# Patient Record
Sex: Female | Born: 1960
Health system: Midwestern US, Community
[De-identification: ages and names within clinical notes are randomized; demographics above are authoritative.]

## PROBLEM LIST (undated history)

## (undated) DIAGNOSIS — S8991XA Unspecified injury of right lower leg, initial encounter: Secondary | ICD-10-CM

## (undated) DIAGNOSIS — E785 Hyperlipidemia, unspecified: Secondary | ICD-10-CM

## (undated) DIAGNOSIS — Z0000 Encounter for general adult medical examination without abnormal findings: Secondary | ICD-10-CM

## (undated) DIAGNOSIS — M419 Scoliosis, unspecified: Secondary | ICD-10-CM

## (undated) DIAGNOSIS — Z780 Asymptomatic menopausal state: Secondary | ICD-10-CM

## (undated) DIAGNOSIS — M1712 Unilateral primary osteoarthritis, left knee: Secondary | ICD-10-CM

## (undated) DIAGNOSIS — B019 Varicella without complication: Secondary | ICD-10-CM

## (undated) DIAGNOSIS — N390 Urinary tract infection, site not specified: Principal | ICD-10-CM

## (undated) DIAGNOSIS — G43829 Menstrual migraine, not intractable, without status migrainosus: Secondary | ICD-10-CM

## (undated) HISTORY — DX: Asymptomatic menopausal state: Z78.0

## (undated) HISTORY — DX: Unilateral primary osteoarthritis, left knee: M17.12

## (undated) HISTORY — DX: Urinary tract infection, site not specified: N39.0

## (undated) HISTORY — DX: Varicella without complication: B01.9

## (undated) HISTORY — DX: Menstrual migraine, not intractable, without status migrainosus: G43.829

## (undated) HISTORY — DX: Scoliosis, unspecified: M41.9

## (undated) HISTORY — DX: Unspecified injury of right lower leg, initial encounter: S89.91XA

## (undated) HISTORY — DX: Hyperlipidemia, unspecified: E78.5

## (undated) HISTORY — DX: Encounter for general adult medical examination without abnormal findings: Z00.00

---

## 1996-11-05 LAB — CONVERTED CEMR LAB
Cholesterol: 217 mg/dL
LDL Cholesterol: 108 mg/dL

## 2005-02-09 ENCOUNTER — Other Ambulatory Visit: Admission: RE | Admit: 2005-02-09 | Discharge: 2005-02-09 | Payer: Self-pay | Admitting: Obstetrics and Gynecology

## 2005-05-19 ENCOUNTER — Ambulatory Visit (HOSPITAL_COMMUNITY): Admission: RE | Admit: 2005-05-19 | Discharge: 2005-05-19 | Payer: Self-pay | Admitting: *Deleted

## 2006-03-18 ENCOUNTER — Other Ambulatory Visit: Admission: RE | Admit: 2006-03-18 | Discharge: 2006-03-18 | Payer: Self-pay | Admitting: Obstetrics & Gynecology

## 2006-05-23 ENCOUNTER — Ambulatory Visit (HOSPITAL_COMMUNITY): Admission: RE | Admit: 2006-05-23 | Discharge: 2006-05-23 | Payer: Self-pay | Admitting: Obstetrics and Gynecology

## 2007-03-20 ENCOUNTER — Other Ambulatory Visit: Admission: RE | Admit: 2007-03-20 | Discharge: 2007-03-20 | Payer: Self-pay | Admitting: Obstetrics & Gynecology

## 2007-05-27 LAB — CONVERTED CEMR LAB: Pap Smear: NORMAL

## 2007-06-01 ENCOUNTER — Ambulatory Visit (HOSPITAL_COMMUNITY): Admission: RE | Admit: 2007-06-01 | Discharge: 2007-06-01 | Payer: Self-pay | Admitting: Obstetrics and Gynecology

## 2007-09-01 ENCOUNTER — Ambulatory Visit: Payer: Self-pay | Admitting: Internal Medicine

## 2007-09-01 LAB — CONVERTED CEMR LAB
Bilirubin Urine: NEGATIVE
Blood in Urine, dipstick: NEGATIVE
Glucose, Urine, Semiquant: NEGATIVE
Ketones, urine, test strip: NEGATIVE
Nitrite: NEGATIVE
Protein, U semiquant: NEGATIVE
Specific Gravity, Urine: 1.01
Urobilinogen, UA: 0.2
WBC Urine, dipstick: NEGATIVE
pH: 7

## 2007-09-04 LAB — CONVERTED CEMR LAB
ALT: 20 units/L (ref 0–35)
AST: 19 units/L (ref 0–37)
Albumin: 4.1 g/dL (ref 3.5–5.2)
Alkaline Phosphatase: 41 units/L (ref 39–117)
BUN: 9 mg/dL (ref 6–23)
Basophils Absolute: 0.1 10*3/uL (ref 0.0–0.1)
Basophils Relative: 0.8 % (ref 0.0–1.0)
Bilirubin, Direct: 0.2 mg/dL (ref 0.0–0.3)
CO2: 28 meq/L (ref 19–32)
Calcium: 9.4 mg/dL (ref 8.4–10.5)
Chloride: 101 meq/L (ref 96–112)
Cholesterol: 255 mg/dL (ref 0–200)
Creatinine, Ser: 0.8 mg/dL (ref 0.4–1.2)
Direct LDL: 127.4 mg/dL
Eosinophils Absolute: 0.1 10*3/uL (ref 0.0–0.6)
Eosinophils Relative: 1.9 % (ref 0.0–5.0)
GFR calc Af Amer: 99 mL/min
GFR calc non Af Amer: 82 mL/min
Glucose, Bld: 118 mg/dL — ABNORMAL HIGH (ref 70–99)
HCT: 39.6 % (ref 36.0–46.0)
HDL: 95.5 mg/dL (ref >39.0)
Hemoglobin: 13.4 g/dL (ref 12.0–15.0)
Lymphocytes Relative: 32.3 % (ref 12.0–46.0)
MCHC: 33.9 g/dL (ref 30.0–36.0)
MCV: 92.1 fL (ref 78.0–100.0)
Monocytes Absolute: 0.5 10*3/uL (ref 0.2–0.7)
Monocytes Relative: 7.4 % (ref 3.0–11.0)
Neutro Abs: 4 10*3/uL (ref 1.4–7.7)
Neutrophils Relative %: 57.6 % (ref 43.0–77.0)
Platelets: 227 10*3/uL (ref 150–400)
Potassium: 3.7 meq/L (ref 3.5–5.1)
RBC: 4.3 M/uL (ref 3.87–5.11)
RDW: 12.4 % (ref 11.5–14.6)
Sodium: 137 meq/L (ref 135–145)
TSH: 0.69 microintl units/mL (ref 0.35–5.50)
Total Bilirubin: 0.7 mg/dL (ref 0.3–1.2)
Total CHOL/HDL Ratio: 2.7
Total Protein: 6.4 g/dL (ref 6.0–8.3)
Triglycerides: 150 mg/dL — ABNORMAL HIGH (ref 0–149)
VLDL: 30 mg/dL (ref 0–40)
WBC: 7 10*3/uL (ref 4.5–10.5)

## 2007-09-29 ENCOUNTER — Ambulatory Visit: Payer: Self-pay | Admitting: Internal Medicine

## 2007-10-16 ENCOUNTER — Telehealth: Payer: Self-pay | Admitting: Internal Medicine

## 2008-04-30 ENCOUNTER — Other Ambulatory Visit: Admission: RE | Admit: 2008-04-30 | Discharge: 2008-04-30 | Payer: Self-pay | Admitting: Obstetrics and Gynecology

## 2008-08-29 ENCOUNTER — Ambulatory Visit (HOSPITAL_COMMUNITY): Admission: RE | Admit: 2008-08-29 | Discharge: 2008-08-29 | Payer: Self-pay | Admitting: Obstetrics and Gynecology

## 2009-05-20 ENCOUNTER — Telehealth: Payer: Self-pay | Admitting: Internal Medicine

## 2009-07-26 HISTORY — PX: ARTHROSCOPIC REPAIR ACL: SUR80

## 2010-09-07 ENCOUNTER — Other Ambulatory Visit: Payer: Self-pay | Admitting: Obstetrics and Gynecology

## 2010-09-07 DIAGNOSIS — Z1231 Encounter for screening mammogram for malignant neoplasm of breast: Secondary | ICD-10-CM

## 2010-09-14 ENCOUNTER — Ambulatory Visit (HOSPITAL_COMMUNITY)
Admission: RE | Admit: 2010-09-14 | Discharge: 2010-09-14 | Disposition: A | Discharge status: Home / Self Care | Payer: Managed Care, Other (non HMO) | Source: Ambulatory Visit | Attending: Obstetrics and Gynecology | Admitting: Obstetrics and Gynecology

## 2010-09-14 DIAGNOSIS — Z1231 Encounter for screening mammogram for malignant neoplasm of breast: Secondary | ICD-10-CM

## 2010-09-14 LAB — HM MAMMOGRAPHY

## 2011-07-22 ENCOUNTER — Ambulatory Visit: Payer: Managed Care, Other (non HMO) | Admitting: Family Medicine

## 2011-08-12 LAB — HM PAP SMEAR

## 2011-08-19 ENCOUNTER — Encounter: Payer: Self-pay | Admitting: Family Medicine

## 2011-08-19 ENCOUNTER — Ambulatory Visit (INDEPENDENT_AMBULATORY_CARE_PROVIDER_SITE_OTHER): Payer: Managed Care, Other (non HMO) | Admitting: Family Medicine

## 2011-08-19 DIAGNOSIS — M4120 Other idiopathic scoliosis, site unspecified: Secondary | ICD-10-CM

## 2011-08-19 DIAGNOSIS — E785 Hyperlipidemia, unspecified: Secondary | ICD-10-CM

## 2011-08-19 DIAGNOSIS — Z0000 Encounter for general adult medical examination without abnormal findings: Secondary | ICD-10-CM

## 2011-08-19 DIAGNOSIS — M419 Scoliosis, unspecified: Secondary | ICD-10-CM

## 2011-08-19 DIAGNOSIS — M25552 Pain in left hip: Secondary | ICD-10-CM

## 2011-08-19 DIAGNOSIS — M25559 Pain in unspecified hip: Secondary | ICD-10-CM

## 2011-08-19 DIAGNOSIS — L989 Disorder of the skin and subcutaneous tissue, unspecified: Secondary | ICD-10-CM

## 2011-08-19 DIAGNOSIS — R0789 Other chest pain: Secondary | ICD-10-CM

## 2011-08-19 DIAGNOSIS — G43829 Menstrual migraine, not intractable, without status migrainosus: Secondary | ICD-10-CM | POA: Insufficient documentation

## 2011-08-19 HISTORY — DX: Hyperlipidemia, unspecified: E78.5

## 2011-08-19 NOTE — Patient Instructions (Signed)

## 2011-08-24 ENCOUNTER — Other Ambulatory Visit (INDEPENDENT_AMBULATORY_CARE_PROVIDER_SITE_OTHER): Payer: Managed Care, Other (non HMO)

## 2011-08-24 DIAGNOSIS — Z0000 Encounter for general adult medical examination without abnormal findings: Secondary | ICD-10-CM

## 2011-08-24 DIAGNOSIS — E785 Hyperlipidemia, unspecified: Secondary | ICD-10-CM

## 2011-08-24 LAB — LIPID PANEL
Cholesterol: 243 mg/dL — ABNORMAL HIGH (ref 0–200)
HDL: 84.1 mg/dL (ref >39.00)
Total CHOL/HDL Ratio: 3
Triglycerides: 75 mg/dL (ref 0.0–149.0)
VLDL: 15 mg/dL (ref 0.0–40.0)

## 2011-08-24 LAB — RENAL FUNCTION PANEL
Albumin: 4.1 g/dL (ref 3.5–5.2)
BUN: 14 mg/dL (ref 6–23)
CO2: 26 mEq/L (ref 19–32)
Calcium: 8.8 mg/dL (ref 8.4–10.5)
Chloride: 107 mEq/L (ref 96–112)
Creatinine, Ser: 0.7 mg/dL (ref 0.4–1.2)
GFR: 89.36 mL/min (ref >60.00)
Glucose, Bld: 88 mg/dL (ref 70–99)
Phosphorus: 3.3 mg/dL (ref 2.3–4.6)
Potassium: 4.4 mEq/L (ref 3.5–5.1)
Sodium: 141 mEq/L (ref 135–145)

## 2011-08-24 LAB — CBC
HCT: 41.9 % (ref 36.0–46.0)
Hemoglobin: 14.2 g/dL (ref 12.0–15.0)
MCHC: 33.8 g/dL (ref 30.0–36.0)
MCV: 94.4 fl (ref 78.0–100.0)
Platelets: 195 10*3/uL (ref 150.0–400.0)
RBC: 4.44 Mil/uL (ref 3.87–5.11)
RDW: 14.1 % (ref 11.5–14.6)
WBC: 5.3 10*3/uL (ref 4.5–10.5)

## 2011-08-24 LAB — TSH: TSH: 0.96 u[IU]/mL (ref 0.35–5.50)

## 2011-08-24 LAB — HEPATIC FUNCTION PANEL
ALT: 16 U/L (ref 0–35)
AST: 21 U/L (ref 0–37)
Albumin: 4.1 g/dL (ref 3.5–5.2)
Alkaline Phosphatase: 41 U/L (ref 39–117)
Bilirubin, Direct: 0 mg/dL (ref 0.0–0.3)
Total Bilirubin: 0.5 mg/dL (ref 0.3–1.2)
Total Protein: 6.5 g/dL (ref 6.0–8.3)

## 2011-08-25 LAB — LDL CHOLESTEROL, DIRECT: Direct LDL: 141.6 mg/dL

## 2011-08-26 ENCOUNTER — Encounter: Payer: Self-pay | Admitting: Family Medicine

## 2011-08-26 DIAGNOSIS — Z0000 Encounter for general adult medical examination without abnormal findings: Secondary | ICD-10-CM

## 2011-08-26 DIAGNOSIS — M25552 Pain in left hip: Secondary | ICD-10-CM | POA: Insufficient documentation

## 2011-08-26 DIAGNOSIS — R0789 Other chest pain: Secondary | ICD-10-CM | POA: Insufficient documentation

## 2011-08-26 DIAGNOSIS — L989 Disorder of the skin and subcutaneous tissue, unspecified: Secondary | ICD-10-CM | POA: Insufficient documentation

## 2011-08-26 DIAGNOSIS — M419 Scoliosis, unspecified: Secondary | ICD-10-CM

## 2011-08-26 HISTORY — DX: Encounter for general adult medical examination without abnormal findings: Z00.00

## 2011-08-26 HISTORY — DX: Scoliosis, unspecified: M41.9

## 2011-08-26 NOTE — Progress Notes (Signed)
Patient ID: Veronica Aguilar, female   DOB: Aug 26, 1960, 51 y.o.   MRN: [id number] Yamileth Hayse [id number] Jan 07, 1961 08/26/2011      Progress Note New Patient  Subjective  Chief Complaint  Chief Complaint  Patient presents with  . Establish Care    transfer from [facility]    HPI   This 51 year old Caucasian female who is in today for new patient appointment. She has a few concerns today one is about skin lesion on her left anterior thigh which has been present for about 2 years. Is not painful does not bleed but has been slowly growing and is slightly raised. She generally reports good health. She's had no recent illness, fevers, chills, chest pain, palpitations, shortness of breath, GI or GU complaints. She does stop with migraines intermittently but is doing better on Micronor. Unfortunately Micronor has not the side effect of tender breasts but otherwise no complaints. She has a mammogram scheduled for next month and follows with OB/GYN for her ongoing gynecologic care.  PMH: see EPIC   Past Surgical History  Procedure Date  . Arthroscopic repair acl 07-26-2008    left    Family History  Problem Relation Age of Onset  . Cancer Mother 22    ovarian- remission  . Hyperlipidemia Mother   . Heart disease Father     valve replaced  . Obesity Brother   . Heart disease Brother     stent  . Macular degeneration Maternal Grandmother   . Appendicitis Maternal Grandfather   . Cancer Paternal Grandfather     lung  . Obesity Brother   . Obesity Brother   . Depression Daughter     History   Social History  . Marital Status: Married    Spouse Name: N/A    Number of Children: N/A  . Years of Education: N/A   Occupational History  . Not on file.   Social History Main Topics  . Smoking status: Never Smoker   . Smokeless tobacco: Never Used  . Alcohol Use: Yes     6 glasses of wine weekly  . Drug Use: No  . Sexually Active: Yes   Other Topics Concern  . Not on file    Social History Narrative  . No narrative on file    No current outpatient prescriptions on file prior to visit.    No Known Allergies  Review of Systems  Review of Systems  Constitutional: Negative for fever and malaise/fatigue.  HENT: Negative for congestion.   Eyes: Negative for discharge.  Respiratory: Negative for shortness of breath.   Cardiovascular: Negative for chest pain, palpitations and leg swelling.  Gastrointestinal: Negative for nausea, abdominal pain and diarrhea.  Genitourinary: Negative for dysuria, urgency, frequency, hematuria and flank pain.  Musculoskeletal: Negative for back pain, joint pain and falls.  Skin: Negative for rash.  Neurological: Negative for loss of consciousness and headaches.  Endo/Heme/Allergies: Negative for polydipsia.  Psychiatric/Behavioral: Negative for depression and suicidal ideas. The patient is not nervous/anxious and does not have insomnia.     Objective  BP 110/71  Pulse 72  Temp(Src) 98.4 F (36.9 C) (Temporal)  Ht 5\' 2"  (1.575 m)  Wt 115 lb 6.4 oz (52.345 kg)  BMI 21.11 kg/m2  SpO2 99%  LMP 07/30/2011  Physical Exam  Physical Exam  Constitutional: She is oriented to person, place, and time and well-developed, well-nourished, and in no distress. No distress.  HENT:  Head: Normocephalic and atraumatic.  Right Ear: External ear normal.  Left Ear: External ear normal.  Nose: Nose normal.  Mouth/Throat: Oropharynx is clear and moist. No oropharyngeal exudate.  Eyes: Conjunctivae are normal. Pupils are equal, round, and reactive to light. Right eye exhibits no discharge. Left eye exhibits no discharge. No scleral icterus.  Neck: Normal range of motion. Neck supple. No thyromegaly present.  Cardiovascular: Normal rate, regular rhythm, normal heart sounds and intact distal pulses.   No murmur heard. Pulmonary/Chest: Effort normal and breath sounds normal. No respiratory distress. She has no wheezes. She has no rales.   Abdominal: Soft. Bowel sounds are normal. She exhibits no distension and no mass. There is no tenderness.  Musculoskeletal: Normal range of motion. She exhibits no edema and no tenderness.  Lymphadenopathy:    She has no cervical adenopathy.  Neurological: She is alert and oriented to person, place, and time. She has normal reflexes. No cranial nerve deficit. Coordination normal.  Skin: Skin is warm and dry. No rash noted. She is not diaphoretic.       Small, raised, white, scaly lesion anterior thigh left  Psychiatric: Mood, memory and affect normal.       Assessment & Plan  Menstrual Migraine:  Doing better on Micronor.  Hyperlipidemia: avoid trans fats, increase exercise, start MegaRed caps daily  Preventative health care: encouraged heart healthy diet, increase exercise  Skin lesion: left anterior thigh, is white and scaly patient is referred to dermatology for further evaluation.

## 2011-08-26 NOTE — Assessment & Plan Note (Signed)
No pain at present encouraged to stay active and start a fatty acid supplement

## 2011-08-26 NOTE — Assessment & Plan Note (Signed)
Improved since swithcing to Micronor.

## 2011-08-26 NOTE — Assessment & Plan Note (Signed)
Encouraged heart healthy diet, regular exercise and will check fasting labs

## 2011-08-26 NOTE — Assessment & Plan Note (Addendum)
Patient referred to dermatology for evaluation and surveillance. Lesion in on left anterior thigh

## 2011-08-26 NOTE — Assessment & Plan Note (Signed)
Avoid trans fats, start MegaRed daily and will check lipid panel

## 2011-08-26 NOTE — Assessment & Plan Note (Deleted)
No pain at this time, has used Flexeril in the past with good effect.  

## 2011-09-06 ENCOUNTER — Other Ambulatory Visit: Payer: Self-pay | Admitting: Obstetrics and Gynecology

## 2011-09-06 DIAGNOSIS — Z1231 Encounter for screening mammogram for malignant neoplasm of breast: Secondary | ICD-10-CM

## 2011-09-30 ENCOUNTER — Ambulatory Visit (HOSPITAL_COMMUNITY)
Admission: RE | Admit: 2011-09-30 | Discharge: 2011-09-30 | Disposition: A | Discharge status: Home / Self Care | Payer: Managed Care, Other (non HMO) | Source: Ambulatory Visit | Attending: Obstetrics and Gynecology | Admitting: Obstetrics and Gynecology

## 2011-09-30 DIAGNOSIS — Z1231 Encounter for screening mammogram for malignant neoplasm of breast: Secondary | ICD-10-CM

## 2012-04-18 ENCOUNTER — Ambulatory Visit (INDEPENDENT_AMBULATORY_CARE_PROVIDER_SITE_OTHER): Payer: Managed Care, Other (non HMO) | Admitting: Family Medicine

## 2012-04-18 ENCOUNTER — Encounter: Payer: Self-pay | Admitting: Family Medicine

## 2012-04-18 VITALS — BP 106/72 | HR 67 | Temp 98.0°F | Ht 62.0 in | Wt 112.1 lb

## 2012-04-18 DIAGNOSIS — N390 Urinary tract infection, site not specified: Secondary | ICD-10-CM | POA: Insufficient documentation

## 2012-04-18 DIAGNOSIS — R319 Hematuria, unspecified: Secondary | ICD-10-CM

## 2012-04-18 HISTORY — DX: Urinary tract infection, site not specified: N39.0

## 2012-04-18 LAB — POCT URINALYSIS DIPSTICK
Bilirubin, UA: NEGATIVE
Glucose, UA: NEGATIVE
Ketones, UA: NEGATIVE
Nitrite, UA: NEGATIVE
Protein, UA: NEGATIVE
Spec Grav, UA: 1.01
Urobilinogen, UA: 0.2
pH, UA: 7

## 2012-04-18 MED ORDER — CIPROFLOXACIN HCL 500 MG PO TABS: 500.0000 mg | ORAL_TABLET | Freq: Two times a day (BID) | ORAL | Status: DC

## 2012-04-18 MED ORDER — ALIGN PO CAPS: 1.0000 | ORAL_CAPSULE | Freq: Every day | ORAL | Status: AC

## 2012-04-18 MED ORDER — PHENAZOPYRIDINE HCL 200 MG PO TABS: 200.0000 mg | ORAL_TABLET | Freq: Three times a day (TID) | ORAL | Status: DC | PRN

## 2012-04-18 NOTE — Assessment & Plan Note (Signed)
Urine sent for culture, started on Ciprofloxacin and Pyridium, asked to increase fluids, start probiotic and cranberry

## 2012-04-18 NOTE — Progress Notes (Signed)
Patient ID: Veronica Aguilar, female   DOB: Dec 31, 1960, 51 y.o.   MRN: [id number] Veronica Aguilar [id number] 1961/04/21 04/18/2012      Progress Note-Follow Up  Subjective  Chief Complaint  Chief Complaint  Patient presents with  . Urinary Tract Infection    painful urination, blood in urine X this morning    HPI  Patient is a 51 year old Caucasian female who is in today with less than 24 hours worth of symptoms. This morning abruptly she started having dysuria and hematuria. She says the hematuria is resolved but she is having urinary urgency and frequency as well. She denies any fevers, chills, back pain, abdominal pain, GI complaints. No chest pain or palpitations other recent illness or shortness of breath or noted. She has increase her fluid consumption and start some cranberry but her symptoms persist   Past Medical History  Diagnosis Date  . Chicken pox as a child  . Measles as a child  . Menstrual migraine   . Hyperlipidemia 08/19/2011  . Scoliosis 08/26/2011  . Preventative health care 08/26/2011  . UTI (lower urinary tract infection) 04/18/2012    Past Surgical History  Procedure Date  . Arthroscopic repair acl 07-26-2008    left    Family History  Problem Relation Age of Onset  . Cancer Mother 37    ovarian- remission  . Hyperlipidemia Mother   . Heart disease Father     valve replaced  . Obesity Brother   . Heart disease Brother     stent  . Macular degeneration Maternal Grandmother   . Appendicitis Maternal Grandfather   . Cancer Paternal Grandfather     lung  . Obesity Brother   . Obesity Brother   . Depression Daughter     History   Social History  . Marital Status: Married    Spouse Name: N/A    Number of Children: N/A  . Years of Education: N/A   Occupational History  . Not on file.   Social History Main Topics  . Smoking status: Never Smoker   . Smokeless tobacco: Never Used  . Alcohol Use: Yes     6 glasses of wine weekly  . Drug Use: No   . Sexually Active: Yes   Other Topics Concern  . Not on file   Social History Narrative  . No narrative on file    Current Outpatient Prescriptions on File Prior to Visit  Medication Sig Dispense Refill  . norethindrone (MICRONOR,CAMILA,ERRIN) 0.35 MG tablet Take 1 tablet by mouth daily.        No Known Allergies  Review of Systems  Review of Systems  Constitutional: Negative for fever, chills and malaise/fatigue.  HENT: Negative for congestion.   Eyes: Negative for discharge.  Respiratory: Negative for shortness of breath.   Cardiovascular: Negative for chest pain, palpitations and leg swelling.  Gastrointestinal: Negative for nausea, abdominal pain, diarrhea, constipation and blood in stool.  Genitourinary: Positive for dysuria, urgency, frequency and hematuria. Negative for flank pain.  Musculoskeletal: Negative for myalgias, back pain and falls.  Skin: Negative for rash.  Neurological: Negative for loss of consciousness and headaches.  Endo/Heme/Allergies: Negative for polydipsia.  Psychiatric/Behavioral: Negative for depression and suicidal ideas. The patient is not nervous/anxious and does not have insomnia.     Objective  BP 106/72  Pulse 67  Temp 98 F (36.7 C) (Temporal)  Ht 5\' 2"  (1.575 m)  Wt 112 lb 1.9 oz (50.857 kg)  BMI 20.51 kg/m2  SpO2  100%  LMP 03/11/2012  Physical Exam  Physical Exam  Constitutional: She is oriented to person, place, and time and well-developed, well-nourished, and in no distress. No distress.  HENT:  Head: Normocephalic and atraumatic.  Eyes: Conjunctivae normal are normal.  Neck: Neck supple. No thyromegaly present.  Cardiovascular: Normal rate, regular rhythm and normal heart sounds.   No murmur heard. Pulmonary/Chest: Effort normal and breath sounds normal. She has no wheezes.  Abdominal: She exhibits no distension and no mass.  Musculoskeletal: She exhibits no edema.  Lymphadenopathy:    She has no cervical  adenopathy.  Neurological: She is alert and oriented to person, place, and time.  Skin: Skin is warm and dry. No rash noted. She is not diaphoretic.  Psychiatric: Memory, affect and judgment normal.    Lab Results  Component Value Date   TSH 0.96 08/24/2011   Lab Results  Component Value Date   WBC 5.3 08/24/2011   HGB 14.2 08/24/2011   HCT 41.9 08/24/2011   MCV 94.4 08/24/2011   PLT 195.0 08/24/2011   Lab Results  Component Value Date   CREATININE 0.7 08/24/2011   BUN 14 08/24/2011   NA 141 08/24/2011   K 4.4 08/24/2011   CL 107 08/24/2011   CO2 26 08/24/2011   Lab Results  Component Value Date   ALT 16 08/24/2011   AST 21 08/24/2011   ALKPHOS 41 08/24/2011   BILITOT 0.5 08/24/2011   Lab Results  Component Value Date   CHOL 243* 08/24/2011   Lab Results  Component Value Date   HDL 84.10 08/24/2011   Lab Results  Component Value Date   LDLCALC 108 11/05/1996   Lab Results  Component Value Date   TRIG 75.0 08/24/2011   Lab Results  Component Value Date   CHOLHDL 3 08/24/2011     Assessment & Plan  UTI (lower urinary tract infection) Urine sent for culture, started on Ciprofloxacin and Pyridium, asked to increase fluids, start probiotic and cranberry

## 2012-04-18 NOTE — Patient Instructions (Addendum)

## 2012-04-21 LAB — URINE CULTURE: Colony Count: >=100000

## 2012-09-09 ENCOUNTER — Other Ambulatory Visit: Payer: Self-pay

## 2013-05-31 ENCOUNTER — Other Ambulatory Visit: Payer: Self-pay

## 2013-06-13 ENCOUNTER — Other Ambulatory Visit: Payer: Self-pay | Admitting: Certified Nurse Midwife

## 2013-06-13 DIAGNOSIS — Z1231 Encounter for screening mammogram for malignant neoplasm of breast: Secondary | ICD-10-CM

## 2013-07-04 ENCOUNTER — Ambulatory Visit (HOSPITAL_COMMUNITY)
Admission: RE | Admit: 2013-07-04 | Discharge: 2013-07-04 | Disposition: A | Discharge status: Home / Self Care | Payer: Managed Care, Other (non HMO) | Source: Ambulatory Visit | Attending: Certified Nurse Midwife | Admitting: Certified Nurse Midwife

## 2013-07-04 DIAGNOSIS — Z1231 Encounter for screening mammogram for malignant neoplasm of breast: Secondary | ICD-10-CM | POA: Insufficient documentation

## 2013-08-09 ENCOUNTER — Encounter: Payer: Self-pay | Admitting: Certified Nurse Midwife

## 2013-08-13 ENCOUNTER — Encounter: Payer: Self-pay | Admitting: Certified Nurse Midwife

## 2013-08-13 ENCOUNTER — Ambulatory Visit (INDEPENDENT_AMBULATORY_CARE_PROVIDER_SITE_OTHER): Payer: Managed Care, Other (non HMO) | Admitting: Certified Nurse Midwife

## 2013-08-13 VITALS — BP 110/64 | HR 68 | Resp 16 | Ht 62.75 in | Wt 118.0 lb

## 2013-08-13 DIAGNOSIS — Z01419 Encounter for gynecological examination (general) (routine) without abnormal findings: Secondary | ICD-10-CM

## 2013-08-13 DIAGNOSIS — N951 Menopausal and female climacteric states: Secondary | ICD-10-CM

## 2013-08-13 DIAGNOSIS — Z0000 Encounter for general adult medical examination without abnormal findings: Secondary | ICD-10-CM

## 2013-08-13 LAB — POCT URINALYSIS DIPSTICK
Bilirubin, UA: NEGATIVE
Blood, UA: NEGATIVE
Glucose, UA: NEGATIVE
Ketones, UA: NEGATIVE
Leukocytes, UA: NEGATIVE
Nitrite, UA: NEGATIVE
Protein, UA: NEGATIVE
Urobilinogen, UA: NEGATIVE
pH, UA: 5

## 2013-08-13 LAB — HEMOGLOBIN, FINGERSTICK: Hemoglobin, fingerstick: 13.3 g/dL (ref 12.0–16.0)

## 2013-08-13 NOTE — Progress Notes (Signed)
Reviewed personally.  M. Suzanne Oliveah Zwack, MD.  

## 2013-08-13 NOTE — Patient Instructions (Signed)

## 2013-08-13 NOTE — Progress Notes (Signed)
53 y.o. [id number] Married Caucasian Fe here for annual exam. Last period 02/2013. Prior to August periods were every 2 months even with Micronor use. Last labs with PCP in 2013 all normal. No health issues today. Patient declines PUS and CA125 limited screening for ovarian cancer.  Patient's last menstrual period was 02/23/2013.          Sexually active: yes  The current method of family planning is oral progesterone-only contraceptive.    Exercising: yes  walking,tennis Smoker:  no  Health Maintenance: Pap:  08-10-11 neg HPV HR neg MMG:  07-04-13 normal category c density Colonoscopy:  none BMD:   none TDaP:  2011 Labs: Poct urine-neg, Hgb-13.3 Self breast exam: done monthly   reports that she has never smoked. She has never used smokeless tobacco. She reports that she drinks about 3.0 ounces of alcohol per week. She reports that she does not use illicit drugs.  Past Medical History  Diagnosis Date  . Chicken pox as a child  . Measles as a child  . Menstrual migraine     with aura  . Hyperlipidemia 08/19/2011  . Scoliosis 08/26/2011  . Preventative health care 08/26/2011  . UTI (lower urinary tract infection) 04/18/2012    Past Surgical History  Procedure Laterality Date  . Arthroscopic repair acl  2011    left    Current Outpatient Prescriptions  Medication Sig Dispense Refill  . CALCIUM PO Take by mouth daily.      . norethindrone (MICRONOR,CAMILA,ERRIN) 0.35 MG tablet Take 1 tablet by mouth daily.      . Omega-3 Fatty Acids (FISH OIL PO) Take by mouth daily.       No current facility-administered medications for this visit.    Family History  Problem Relation Age of Onset  . Cancer Mother 2    ovarian- remission  . Hyperlipidemia Mother   . Heart disease Father     valve replaced  . Glaucoma Father   . Heart disease Brother     stent  . Cancer Brother     melanoma  . Macular degeneration Maternal Grandmother   . Appendicitis Maternal Grandfather   . Cancer  Paternal Grandfather     bone  . Depression Daughter     ROS:  Pertinent items are noted in HPI.  Otherwise, a comprehensive ROS was negative.  Exam:   BP 110/64  Pulse 68  Resp 16  Ht 5' 2.75" (1.594 m)  Wt 118 lb (53.524 kg)  BMI 21.07 kg/m2  LMP 02/23/2013 Height: 5' 2.75" (159.4 cm)  Ht Readings from Last 3 Encounters:  08/13/13 5' 2.75" (1.594 m)  04/18/12 5\' 2"  (1.575 m)  08/19/11 5\' 2"  (1.575 m)    General appearance: alert, cooperative and appears stated age Head: Normocephalic, without obvious abnormality, atraumatic Neck: no adenopathy, supple, symmetrical, trachea midline and thyroid normal to inspection and palpation Lungs: clear to auscultation bilaterally Breasts: normal appearance, no masses or tenderness, No nipple retraction or dimpling, No nipple discharge or bleeding, No axillary or supraclavicular adenopathy Heart: regular rate and rhythm Abdomen: soft, non-tender; no masses,  no organomegaly Extremities: extremities normal, atraumatic, no cyanosis or edema Skin: Skin color, texture, turgor normal. No rashes or lesions Lymph nodes: Cervical, supraclavicular, and axillary nodes normal. No abnormal inguinal nodes palpated Neurologic: Grossly normal   Pelvic: External genitalia:  no lesions              Urethra:  normal appearing urethra with no masses, tenderness  or lesions              Bartholin's and Skene's: normal                 Vagina: normal appearing vagina with normal color and discharge, no lesions              Cervix: normal appearance, non tender              Pap taken: yes Bimanual Exam:  Uterus:  normal size, contour, position, consistency, mobility, non-tender and anteflexed              Adnexa: normal adnexa and no mass, fullness, tenderness               Rectovaginal: Confirms               Anus:  normal sphincter tone, no lesions  A:  Well Woman with normal exam  Contraception POP due to Migraine with aura  Family history of Ovarian  cancer (M 65)  Colonoscopy due  P:   Reviewed health and wellness pertinent to exam  Rx Micronor see order  Lab:FSH, consider AMH after [facility] results   Discussed limited screen for ovarian cancer with PUS and CA 125, and genetic screening option should be considered. Patient declines all. "Feel OK with decision  Discussed risks and benefits with colonoscopy, plans to schedule with PCP.  Pap smear as per guidelines   Mammogram yearly with 3 D consideration pap smear taken with reflex  counseled on breast self exam, mammography screening, menopause, adequate intake of calcium and vitamin D, diet and exercise  return annually or prn  An After Visit Summary was printed and given to the patient.

## 2013-08-14 LAB — IPS PAP TEST WITH REFLEX TO HPV

## 2013-08-14 LAB — FOLLICLE STIMULATING HORMONE: FSH: 58.6 m[IU]/mL

## 2013-08-14 NOTE — Addendum Note (Signed)
Addended by: Regina Eck on: 08/14/2013 07:53 AM   Modules accepted: Orders

## 2013-08-17 LAB — ANTI MULLERIAN HORMONE

## 2013-08-20 ENCOUNTER — Encounter: Payer: Self-pay | Admitting: Certified Nurse Midwife

## 2013-08-21 ENCOUNTER — Telehealth: Payer: Self-pay

## 2013-08-21 NOTE — Telephone Encounter (Signed)
Patient notified of results.

## 2013-08-21 NOTE — Telephone Encounter (Signed)
Message copied by Susy Manor on Tue Aug 21, 2013  1:23 PM ------      Message from: Regina Eck      Created: Tue Aug 21, 2013 12:49 PM       Notify patient that [facility] shows  menopausal range which means she may or may not have another period. This is a range regarding menopausal changes.      The [place] that we talked about result does not show fertility.      I would prefer you stay on the Micronor at least 3 more months to see if any bleeding occurs. Then we can decide together about stopping use. ------

## 2013-08-21 NOTE — Telephone Encounter (Signed)
lmtcb

## 2013-10-23 ENCOUNTER — Other Ambulatory Visit: Payer: Self-pay | Admitting: Certified Nurse Midwife

## 2013-10-24 NOTE — Telephone Encounter (Signed)
eScribe request from [facility] for refill on [place] filled - 2014 Last AEX - 08/13/13 RX not sent at AEX.  RX sent until next AEX.

## 2014-05-27 ENCOUNTER — Encounter: Payer: Self-pay | Admitting: Certified Nurse Midwife

## 2014-07-09 ENCOUNTER — Other Ambulatory Visit: Payer: Self-pay | Admitting: Certified Nurse Midwife

## 2014-07-09 MED ORDER — NORETHINDRONE 0.35 MG PO TABS: 1.0000 | ORAL_TABLET | Freq: Every day | ORAL | Status: DC

## 2014-07-09 NOTE — Telephone Encounter (Signed)
Medication refill request: Norethindrone 0.35 mg  Last AEX:  08/13/13 with Ms. Debbie Next AEX: 08/19/14 with Ms. Debbie Last MMG (if hormonal medication request): 07/05/13 Bi-rads 1: Negative  Refill authorized: #[id number], please advise.

## 2014-07-09 NOTE — Telephone Encounter (Signed)
Pt requests birth control refill - will run out before aex in January.   Asks to send to [facility] (mail order)  bf

## 2014-07-09 NOTE — Telephone Encounter (Signed)
Patient notified that rx has been sent. 

## 2014-08-15 ENCOUNTER — Ambulatory Visit: Payer: Managed Care, Other (non HMO) | Admitting: Certified Nurse Midwife

## 2014-08-19 ENCOUNTER — Ambulatory Visit: Payer: Managed Care, Other (non HMO) | Admitting: Certified Nurse Midwife

## 2014-09-17 ENCOUNTER — Ambulatory Visit: Payer: Managed Care, Other (non HMO) | Admitting: Certified Nurse Midwife

## 2014-09-27 ENCOUNTER — Telehealth: Payer: Self-pay | Admitting: Certified Nurse Midwife

## 2014-09-27 MED ORDER — NORETHINDRONE 0.35 MG PO TABS: 1.0000 | ORAL_TABLET | Freq: Every day | ORAL | Status: DC

## 2014-09-27 NOTE — Telephone Encounter (Signed)
She can stay on Micronor, it is not a problem with drawing [place], will no affect results. Agreeable to plan

## 2014-09-27 NOTE — Telephone Encounter (Signed)
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 Last seen 08/13/13.

## 2014-09-27 NOTE — Telephone Encounter (Signed)
Spoke with patient. Patient states "I recently turned 44 and have not had a period in a year. I am on birth control but I am not sure how long I should continue to be on them." Advised patient will need to stay on birth control until we are able to confirm it is no longer needed. Advised to stay on birth control until aex on 11/20/2014 and discuss with provider and further recommendations can be made from there. Patient is agreeable. Requesting refills for birth control be sent to mail order on file until annual. Micronor #2 0RF sent to mail order on file. Patient is agreeable.    Regina Eck CNM anything further for this patient? Need to be off Micronor for any period of time before appointment to have labs drawn?

## 2014-11-05 ENCOUNTER — Other Ambulatory Visit: Payer: Self-pay | Admitting: Certified Nurse Midwife

## 2014-11-05 DIAGNOSIS — Z1231 Encounter for screening mammogram for malignant neoplasm of breast: Secondary | ICD-10-CM

## 2014-11-07 ENCOUNTER — Ambulatory Visit (HOSPITAL_COMMUNITY)
Admission: RE | Admit: 2014-11-07 | Discharge: 2014-11-07 | Disposition: A | Discharge status: Home / Self Care | Payer: Managed Care, Other (non HMO) | Source: Ambulatory Visit | Attending: Certified Nurse Midwife | Admitting: Certified Nurse Midwife

## 2014-11-07 DIAGNOSIS — Z1231 Encounter for screening mammogram for malignant neoplasm of breast: Secondary | ICD-10-CM | POA: Insufficient documentation

## 2014-11-13 ENCOUNTER — Encounter: Payer: Self-pay | Admitting: Obstetrics and Gynecology

## 2014-11-13 ENCOUNTER — Ambulatory Visit (INDEPENDENT_AMBULATORY_CARE_PROVIDER_SITE_OTHER): Payer: Managed Care, Other (non HMO) | Admitting: Obstetrics and Gynecology

## 2014-11-13 VITALS — BP 100/62 | HR 64 | Resp 18 | Ht 62.5 in | Wt 111.4 lb

## 2014-11-13 DIAGNOSIS — Z01419 Encounter for gynecological examination (general) (routine) without abnormal findings: Secondary | ICD-10-CM

## 2014-11-13 DIAGNOSIS — Z0000 Encounter for general adult medical examination without abnormal findings: Secondary | ICD-10-CM | POA: Diagnosis not present

## 2014-11-13 DIAGNOSIS — N939 Abnormal uterine and vaginal bleeding, unspecified: Secondary | ICD-10-CM

## 2014-11-13 LAB — POCT URINALYSIS DIPSTICK
Bilirubin, UA: NEGATIVE
Blood, UA: NEGATIVE
GLUCOSE UA: NEGATIVE
KETONES UA: NEGATIVE
LEUKOCYTES UA: NEGATIVE
Nitrite, UA: NEGATIVE
PROTEIN UA: NEGATIVE
Urobilinogen, UA: NEGATIVE
pH, UA: 5

## 2014-11-13 NOTE — Progress Notes (Signed)
Patient ID: Veronica Aguilar, female   DOB: May 09, 1961, 54 y.o.   MRN: [id number] 54 y.o. [id number] MarriedCaucasianF here for annual exam.   Asking when to stop oral contraceptives.  Having night sweats.  Patient is on Micronor.  Forgets to take it.  FSH 58.6 and AMH < 0.03 on 08/13/13.   Had spotting for 2 - 3 days in early March this year.  This occurred just after mother passed. LMP was one full year prior.  Hot flashes are back.  Night sweats.   Mother had prior ovarian cancer.  Mother just deceased  - dementia?  Has 2 girls.   PCP:  Gwyneth Revels, MD  Patient's last menstrual period was 09/13/2014.          Sexually active: Yes.   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    walking and plays tennis twice weekly. Smoker:  no  Health Maintenance: Pap:  08-13-13 wnl:no HPV testing History of abnormal Pap:  no MMG:  11-07-14 dense/nl:[facility] Colonoscopy:  NEVER BMD:   N/A TDaP:  2011 Screening Labs:   Hb today: PCP, Urine today: Neg   reports that she has never smoked. She has never used smokeless tobacco. She reports that she drinks about 4.2 - 8.4 oz of alcohol per week. She reports that she does not use illicit drugs.  Past Medical History  Diagnosis Date  . Chicken pox as a child  . Measles as a child  . Menstrual migraine     with aura  . Hyperlipidemia 08/19/2011  . Scoliosis 08/26/2011  . Preventative health care 08/26/2011  . UTI (lower urinary tract infection) 04/18/2012    Past Surgical History  Procedure Laterality Date  . Arthroscopic repair acl  2011    left    Current Outpatient Prescriptions  Medication Sig Dispense Refill  . CALCIUM PO Take by mouth daily.    . Misc Natural Products (OSTEO BI-FLEX TRIPLE STRENGTH PO) Take 1 tablet by mouth 2 (two) times daily.    . norethindrone (MICRONOR,CAMILA,ERRIN) 0.35 MG tablet Take 1 tablet (0.35 mg total) by mouth  daily. 2 Package 0  . Omega-3 Fatty Acids (FISH OIL PO) Take by mouth daily.     No current facility-administered medications for this visit.    Family History  Problem Relation Age of Onset  . Cancer Mother 58    ovarian- remission  . Hyperlipidemia Mother   . Heart disease Father     valve replaced  . Glaucoma Father   . Heart disease Brother     stent  . Cancer Brother     melanoma  . Macular degeneration Maternal Grandmother   . Appendicitis Maternal Grandfather   . Cancer Paternal Grandfather     bone  . Depression Daughter     ROS:  Pertinent items are noted in HPI.  Otherwise, a comprehensive ROS was negative.  Exam:   BP 100/62 mmHg  Pulse 64  Resp 18  Ht 5' 2.5" (1.588 m)  Wt 111 lb 6.4 oz (50.531 kg)  BMI 20.04 kg/m2  LMP 09/13/2014     Height: 5' 2.5" (158.8 cm)  Ht Readings from Last 3 Encounters:  11/13/14 5' 2.5" (1.588 m)  08/13/13 5' 2.75" (1.594 m)  04/18/12 5\' 2"  (1.575 m)    General appearance: alert, cooperative and appears stated age Head: Normocephalic, without obvious abnormality, atraumatic Neck: no adenopathy, supple, symmetrical, trachea midline and  thyroid normal to inspection and palpation Lungs: clear to auscultation bilaterally Breasts: normal appearance, no masses or tenderness, Inspection negative, No nipple retraction or dimpling, No nipple discharge or bleeding, No axillary or supraclavicular adenopathy Heart: regular rate and rhythm Abdomen: soft, non-tender; bowel sounds normal; no masses,  no organomegaly Extremities: extremities normal, atraumatic, no cyanosis or edema Skin: Skin color, texture, turgor normal. No rashes or lesions Lymph nodes: Cervical, supraclavicular, and axillary nodes normal. No abnormal inguinal nodes palpated Neurologic: Grossly normal   Pelvic: External genitalia:  no lesions              Urethra:  normal appearing urethra with no masses, tenderness or lesions              Bartholins and Skenes:  normal                 Vagina: normal appearing vagina with normal color and discharge, no lesions              Cervix: no lesions              Pap taken: No. Bimanual Exam:  Uterus:  normal size, contour, position, consistency, mobility, non-tender              Adnexa: normal adnexa and no mass, fullness, tenderness               Rectovaginal: Confirms               Anus:  normal sphincter tone, no lesions  Chaperone was present for exam.  A:  Well Woman with normal exam Postmenopausal bleeding.  On Micronor.  Menopausal symptoms.  Family history of ovarian cancer.   P:   Mammogram yearly.  pap smear not indicated.  Will have patient stop Micronor.  Will test [facility] and estradiol off OCPs for 2 weeks.  Return for pelvic ultrasound and potential endometrial biopsy.  Procedures explained.  Colonoscopy recommended. return annually or prn

## 2014-11-13 NOTE — Patient Instructions (Signed)
EXERCISE AND DIET:  We recommended that you start or continue a regular exercise program for good health. Regular exercise means any activity that makes your heart beat faster and makes you sweat.  We recommend exercising at least 30 minutes per day at least 3 days a week, preferably 4 or 5.  We also recommend a diet low in fat and sugar.  Inactivity, poor dietary choices and obesity can cause diabetes, heart attack, stroke, and kidney damage, among others.    ALCOHOL AND SMOKING:  Women should limit their alcohol intake to no more than 7 drinks/beers/glasses of wine (combined, not each!) per week. Moderation of alcohol intake to this level decreases your risk of breast cancer and liver damage. And of course, no recreational drugs are part of a healthy lifestyle.  And absolutely no smoking or even second hand smoke. Most people know smoking can cause heart and lung diseases, but did you know it also contributes to weakening of your bones? Aging of your skin?  Yellowing of your teeth and nails?  CALCIUM AND VITAMIN D:  Adequate intake of calcium and Vitamin D are recommended.  The recommendations for exact amounts of these supplements seem to change often, but generally speaking 600 mg of calcium (either carbonate or citrate) and 800 units of Vitamin D per day seems prudent. Certain women may benefit from higher intake of Vitamin D.  If you are among these women, your doctor will have told you during your visit.    PAP SMEARS:  Pap smears, to check for cervical cancer or precancers,  have traditionally been done yearly, although recent scientific advances have shown that most women can have pap smears less often.  However, every woman still should have a physical exam from her gynecologist every year. It will include a breast check, inspection of the vulva and vagina to check for abnormal growths or skin changes, a visual exam of the cervix, and then an exam to evaluate the size and shape of the uterus and  ovaries.  And after 54 years of age, a rectal exam is indicated to check for rectal cancers. We will also provide age appropriate advice regarding health maintenance, like when you should have certain vaccines, screening for sexually transmitted diseases, bone density testing, colonoscopy, mammograms, etc.   MAMMOGRAMS:  All women over 40 years old should have a yearly mammogram. Many facilities now offer a "3D" mammogram, which may cost around $50 extra out of pocket. If possible,  we recommend you accept the option to have the 3D mammogram performed.  It both reduces the number of women who will be called back for extra views which then turn out to be normal, and it is better than the routine mammogram at detecting truly abnormal areas.    COLONOSCOPY:  Colonoscopy to screen for colon cancer is recommended for all women at age 50.  We know, you hate the idea of the prep.  We agree, BUT, having colon cancer and not knowing it is worse!!  Colon cancer so often starts as a polyp that can be seen and removed at colonscopy, which can quite literally save your life!  And if your first colonoscopy is normal and you have no family history of colon cancer, most women don't have to have it again for 10 years.  Once every ten years, you can do something that may end up saving your life, right?  We will be happy to help you get it scheduled when you are ready.    Be sure to check your insurance coverage so you understand how much it will cost.  It may be covered as a preventative service at no cost, but you should check your particular policy.     Abdominal or Pelvic Ultrasound Ultrasound uses harmless sound waves instead of X-rays to take pictures of the inside of your body. A probe or wand device (transducer) is held up against your body to capture these pictures. The continually changing images can be recorded on videotape or film. Diagnostic ultrasound imaging is commonly called sonography or ultrasonography. There  are different types of ultrasound exams. An ultrasound of the gallbladder, liver, and pancreas can show gallstones, masses, cysts, inflammation, infection, or enlarged organs. An ultrasound of the kidneys can show cysts, masses, kidney stones, and kidney size and shape. A pelvic ultrasound can show the uterus, ovaries, and cysts or masses. An obstetrical ultrasound shows the position of the fetus, measurements for maturity, fetal heartbeat, and fetal organs. A breast, thyroid, or testicular ultrasound can show if a nodule is solid or cystic. RISKS AND COMPLICATIONS Ultrasound has been used for many years and has never shown any harmful effects. Studies in humans have shown no direct link between the use of ultrasound and adverse outcomes. BEFORE THE PROCEDURE Other than drinking water, do not eat or drink for 8 to 12 hours before the test or as directed by your caregiver. Follow any other diet instructions from your caregiver. If you are having a pelvic ultrasound, you may need to drink a lot of liquid before the exam. A full bladder helps to see the organs behind the bladder better. PROCEDURE  There is no pain in an ultrasound exam. A gel is applied to your skin, and the transducer is then placed on the area to be examined. The gel may feel cool. The gel wipes off easily, but it is a good idea to wear clothing that is easily washable. The images from inside your body are displayed on one or more monitors that look like small television screens. The returning sound waves produce pictures of the organs that were in the path of the sound sent from the transducer. The room is usually darkened during the exam. This makes it easier to see the images on the monitor. The ultrasound exam should take less than 1 hour. AFTER THE PROCEDURE You can safely drive home and return to regular activities immediately after the exam. Ask when your test results will be ready. Make sure you get your test results. Document  Released: 07/09/2000 Document Revised: 10/04/2011 Document Reviewed: 12/31/2010 [facility] Patient Information 2015 [place], [place]. This information is not intended to replace advice given to you by your health care provider. Make sure you discuss any questions you have with your health care provider.  Endometrial Biopsy Endometrial biopsy is a procedure in which a tissue sample is taken from inside the uterus. The tissue sample is then looked at under a microscope to see if the tissue is normal or abnormal. The endometrium is the lining of the uterus. This procedure helps determine where you are in your menstrual cycle and how hormone levels are affecting the lining of the uterus. This procedure may also be used to evaluate uterine bleeding or to diagnose endometrial cancer, tuberculosis, polyps, or inflammatory conditions.  LET [facility] CARE PROVIDER KNOW ABOUT:  Any allergies you have.  All medicines you are taking, including vitamins, herbs, eye drops, creams, and over-the-counter medicines.  Previous problems you or members of your  family have had with the use of anesthetics.  Any blood disorders you have.  Previous surgeries you have had.  Medical conditions you have.  Possibility of pregnancy. RISKS AND COMPLICATIONS Generally, this is a safe procedure. However, as with any procedure, complications can occur. Possible complications include:  Bleeding.  Pelvic infection.  Puncture of the uterine wall with the biopsy device (rare). BEFORE THE PROCEDURE   Keep a record of your menstrual cycles as directed by your health care provider. You may need to schedule your procedure for a specific time in your cycle.  You may want to bring a sanitary pad to wear home after the procedure.  Arrange for someone to drive you home after the procedure if you will be given a medicine to help you relax (sedative). PROCEDURE   You may be given a sedative to relax you.  You will lie on an  exam table with your feet and legs supported as in a pelvic exam.  Your health care provider will insert an instrument (speculum) into your vagina to see your cervix.  Your cervix will be cleansed with an antiseptic solution. A medicine (local anesthetic) will be used to numb the cervix.  A forceps instrument (tenaculum) will be used to hold your cervix steady for the biopsy.  A thin, rodlike instrument (uterine sound) will be inserted through your cervix to determine the length of your uterus and the location where the biopsy sample will be removed.  A thin, flexible tube (catheter) will be inserted through your cervix and into the uterus. The catheter is used to collect the biopsy sample from your endometrial tissue.  The catheter and speculum will then be removed, and the tissue sample will be sent to a lab for examination. AFTER THE PROCEDURE  You will rest in a recovery area until you are ready to go home.  You may have mild cramping and a small amount of vaginal bleeding for a few days after the procedure. This is normal.  Make sure you find out how to get your test results. Document Released: 11/12/2004 Document Revised: 03/14/2013 Document Reviewed: 12/27/2012 [facility] Patient Information 2015 [place], [place]. This information is not intended to replace advice given to you by your health care provider. Make sure you discuss any questions you have with your health care provider.

## 2014-11-19 ENCOUNTER — Telehealth: Payer: Self-pay | Admitting: Obstetrics and Gynecology

## 2014-11-19 NOTE — Telephone Encounter (Signed)
Left message for patient to call back. Need to go over benefits for PUS/EMB

## 2014-11-20 ENCOUNTER — Ambulatory Visit: Payer: Managed Care, Other (non HMO) | Admitting: Certified Nurse Midwife

## 2014-11-27 NOTE — Telephone Encounter (Signed)
Left message for patient to call back  

## 2015-01-01 ENCOUNTER — Telehealth: Payer: Self-pay | Admitting: Obstetrics and Gynecology

## 2015-01-01 NOTE — Telephone Encounter (Signed)
Left message for patient to call back. Following up to see if patient is ready to schedule PUS/EMB

## 2015-01-02 ENCOUNTER — Encounter: Payer: Managed Care, Other (non HMO) | Admitting: Family Medicine

## 2015-01-30 ENCOUNTER — Telehealth: Payer: Self-pay | Admitting: *Deleted

## 2015-01-30 NOTE — Telephone Encounter (Signed)
See next phone note for attempt to contact patient.  Routing to provider for final review. Encounter closed.

## 2015-01-30 NOTE — Telephone Encounter (Signed)
See previous phone notes with attempts to contact patient regarding scheduling. Follow-up call to patient regarding lab tests and pelvic ultrasound with endometrial biopsy.  Left message on cell (VM confirms Veronica Aguilar) and home number (number confirmation.)

## 2015-02-19 ENCOUNTER — Telehealth: Payer: Self-pay | Admitting: Family Medicine

## 2015-02-19 NOTE — Telephone Encounter (Signed)
Caller name: Stefannie Defeo Relationship to patient: self Can be reached: [phone]   Reason for call: GYN office wanted her to get some additional testing and ultrasound. She wants some advice from Dr. Charlett Blake.

## 2015-02-19 NOTE — Telephone Encounter (Signed)
Follow-up call to patient regarding ordered pelvic ultrasound. Patient states she spoke to someone (was actually billing coordinator) that she wants to speak to PCP before scheduling. She reports she feels fine and is not having any further bleeding. Scheduled for physical in September and she wants to discuss mothers ovarian cancer and need for additional testing with PCP.  Patient also states this testing is not covered by insurance.  Provided clarification to patient concerns: testing is covered by insurance; however, is being applied to deductible. Testing is to evaluate uterus, endometrial lining which is different than evaluating ovarian cancer. Although glad she is feeling well, PMB should always be evaluated. Amount of bleeding is not reflective of the degree of potential abnormality and that this is being ordered to rule out abnormal cells up to and including cancerous cells in the uterus. Offered to at least start with ultrasound and determine next step but advised that MD would review call. Also advised that we respect her right to seek additional opinion but since appointment is in September, would encourage her to call and ask over the phone rather than wait two more months. Patient states she will call Dr Azalee Course office tomorrow and let me know if decides to schedule.   Again, advised MD would review call. Any further follow-up?

## 2015-02-19 NOTE — Telephone Encounter (Signed)
I think that a reasonable place to start with the patient is blood work to see her hormonal status off of the [place]. Her ultrasound evaluation is a good way to look for abnormalities of the ovaries and the uterus. The need for biopsy can be determined further at that time.  Genetic counseling to determine risk of future ovarian cancer may be helpful for the patient in order to stratify her risk. Without more information, difficult to counsel patient about the meaning of the bleeding she had earlier this year.   I am happy to help, and I agree that I would not delay in completing her evaluation.

## 2015-02-20 NOTE — Telephone Encounter (Signed)
Patient has been called and informed of PCP response to question.  The patient did verbalize understanding and agreed to.

## 2015-02-20 NOTE — Telephone Encounter (Signed)
So I need to know what they are recommending and for what symptoms and then I can offer my thoughts. Please check with her.

## 2015-02-20 NOTE — Telephone Encounter (Signed)
Tough call. It could be the birth control but what it comes down to is we assume that post menopausal bleeding is cancer until we prove otherwise. This is a precaution so we do not miss anything, I am a worrier so I would encourage her to get the work up done as a precaution if they do the work up and it is negative it is done and we do not do anything else unless new bleeding occurs

## 2015-02-20 NOTE — Telephone Encounter (Signed)
Patient was seen in March by a new GYN.  At that time she spotted once in February but was on [facility] and missed taking a coouple of days.  The gyn was concerned and stated she is in menopause and should not spot, stated was troublesome and wanted her to have an Ultrasound and potentially a biopsy.  She wants to know if necessary and is having no symptoms and no bleeding, not since February.  She is just curious what her PCP states, she can wait until September to discuss if necessary as she is having no issues at this time, but GYN is being persistent to have these test done.

## 2015-02-20 NOTE — Telephone Encounter (Signed)
Called the patient left message to call back 

## 2015-02-25 NOTE — Telephone Encounter (Signed)
Call to patient, left message to call back. Follow- up to call from last week. "Reviewed with doctor and have new update."

## 2015-03-13 NOTE — Telephone Encounter (Signed)
Call to patient. Left message, calling to follow-up on call from couple of week ago. Left message to call back.

## 2015-03-18 ENCOUNTER — Telehealth: Payer: Self-pay | Admitting: Family Medicine

## 2015-03-18 ENCOUNTER — Other Ambulatory Visit: Payer: Self-pay | Admitting: Family Medicine

## 2015-03-18 DIAGNOSIS — Z1211 Encounter for screening for malignant neoplasm of colon: Secondary | ICD-10-CM

## 2015-03-18 NOTE — Telephone Encounter (Signed)
Called the patient informed requested referral done.

## 2015-03-18 NOTE — Telephone Encounter (Signed)
Ordered with [place]

## 2015-03-18 NOTE — Telephone Encounter (Signed)
Caller name: Arionna Relationship to patient: self Can be reached: [phone]  Reason for call: Pt is saying she is due for a colonoscopy. She has not had one before. She is asking for recommendation/referral to have one done. Please advise pt.

## 2015-03-18 NOTE — Telephone Encounter (Signed)
Advise on request for colonoscopy referral.  Patient has upcoming appointment with PCP on 04/11/15, but last appointment was on 04/18/12.Marland KitchenMarland Kitchen

## 2015-03-21 NOTE — Telephone Encounter (Signed)
Call to patient. Voice mail confirms "Veronica Aguilar." Left message this is follow-up to previous call, reviewed with dr Quincy Simmonds and have additional information for her. Left message to call back and can speak to any of the triage nurses.

## 2015-03-24 NOTE — Telephone Encounter (Signed)
No response from patient. Any additional follow-up? Please advise.

## 2015-03-24 NOTE — Telephone Encounter (Signed)
Please send letter to patient asking her to respond to [place] in 30 days.  We want to partner with her in her care and provide good recommendations regarding the bleeding episode she had.  Without further evaluation, it is difficult to render an opinion regarding the etiology of her bleeding.

## 2015-03-27 ENCOUNTER — Encounter: Payer: Self-pay | Admitting: *Deleted

## 2015-03-27 NOTE — Telephone Encounter (Signed)
Letter to your office for review. 

## 2015-04-01 NOTE — Telephone Encounter (Signed)
Veronica Aguilar,   After reviewing with Dr Quincy Simmonds, would you like to try to contact this patient again before we send a thirty day letter?  Letter is ready to be sent if patient does not respond. She has not returned my calls despite messages stating that I have "updated" information. Please advise.  CC: Dr Quincy Simmonds

## 2015-04-01 NOTE — Telephone Encounter (Signed)
I will try her when in office in am.

## 2015-04-03 ENCOUNTER — Telehealth: Payer: Self-pay | Admitting: Certified Nurse Midwife

## 2015-04-03 NOTE — Telephone Encounter (Signed)
Unable to leave message, will try again at end of day

## 2015-04-03 NOTE — Telephone Encounter (Signed)
Unable to leave message to call back, no answer

## 2015-04-04 ENCOUNTER — Telehealth: Payer: Self-pay | Admitting: Certified Nurse Midwife

## 2015-04-04 NOTE — Telephone Encounter (Signed)
LMTCB regarding evaluation.

## 2015-04-11 ENCOUNTER — Encounter: Payer: Managed Care, Other (non HMO) | Admitting: Family Medicine

## 2015-04-17 ENCOUNTER — Telehealth: Payer: Self-pay | Admitting: Obstetrics and Gynecology

## 2015-04-17 NOTE — Telephone Encounter (Signed)
Thirty day letter signed by me.  No response from patient for multiple attempts to contact.  Need to be sent registered.

## 2015-04-17 NOTE — Telephone Encounter (Signed)
Remove from referral que.  Will receive a 30 day letter as she is not responding to our recommendations.

## 2015-04-17 NOTE — Telephone Encounter (Signed)
Patient has an ultrasound order in referrals workque that was originally entered in April 2016. Due to multiple contact and scheduling issues, she is still not scheduled. Last communications were regarding sending patient 30 day letter. Please advise status of this so referrals may update status of order.

## 2015-04-23 ENCOUNTER — Telehealth: Payer: Self-pay | Admitting: Certified Nurse Midwife

## 2015-04-23 NOTE — Telephone Encounter (Signed)
Debbi,  Can you contact patient? She has been sent a 30 day letter for not returning to evaluate for post menopausal bleeding.

## 2015-04-23 NOTE — Telephone Encounter (Signed)
Patient calling requesting to speak with Ms. Debbie she has a question for her. Best callback #: [phone]

## 2015-04-24 ENCOUNTER — Encounter: Payer: Self-pay | Admitting: Behavioral Health

## 2015-04-24 ENCOUNTER — Telehealth: Payer: Self-pay | Admitting: Behavioral Health

## 2015-04-24 NOTE — Addendum Note (Signed)
Addended by: Eduard Roux E on: 04/24/2015 12:04 PM   Modules accepted: Orders, Medications

## 2015-04-24 NOTE — Telephone Encounter (Signed)
Pre-Visit Call completed with patient and chart updated.   Pre-Visit Info documented in Specialty Comments under [employer].    

## 2015-04-24 NOTE — Telephone Encounter (Signed)
Unable to reach patient at time of Pre-Visit Call.  Left message for patient to return call when available.    

## 2015-04-25 ENCOUNTER — Ambulatory Visit (INDEPENDENT_AMBULATORY_CARE_PROVIDER_SITE_OTHER): Payer: Managed Care, Other (non HMO) | Admitting: Family Medicine

## 2015-04-25 ENCOUNTER — Encounter: Payer: Self-pay | Admitting: Family Medicine

## 2015-04-25 VITALS — BP 102/62 | HR 81 | Temp 97.6°F | Ht 63.0 in | Wt 100.1 lb

## 2015-04-25 DIAGNOSIS — E785 Hyperlipidemia, unspecified: Secondary | ICD-10-CM | POA: Diagnosis not present

## 2015-04-25 DIAGNOSIS — N943 Premenstrual tension syndrome: Secondary | ICD-10-CM

## 2015-04-25 DIAGNOSIS — G43829 Menstrual migraine, not intractable, without status migrainosus: Secondary | ICD-10-CM | POA: Diagnosis not present

## 2015-04-25 DIAGNOSIS — Z0000 Encounter for general adult medical examination without abnormal findings: Secondary | ICD-10-CM | POA: Diagnosis not present

## 2015-04-25 LAB — CBC
HEMATOCRIT: 43.5 % (ref 36.0–46.0)
HEMOGLOBIN: 14.2 g/dL (ref 12.0–15.0)
MCHC: 32.6 g/dL (ref 30.0–36.0)
MCV: 91.4 fl (ref 78.0–100.0)
Platelets: 229 10*3/uL (ref 150.0–400.0)
RBC: 4.76 Mil/uL (ref 3.87–5.11)
RDW: 14.6 % (ref 11.5–15.5)
WBC: 4.8 10*3/uL (ref 4.0–10.5)

## 2015-04-25 LAB — COMPREHENSIVE METABOLIC PANEL
ALK PHOS: 70 U/L (ref 39–117)
ALT: 22 U/L (ref 0–35)
AST: 21 U/L (ref 0–37)
Albumin: 4.5 g/dL (ref 3.5–5.2)
BUN: 11 mg/dL (ref 6–23)
CO2: 31 mEq/L (ref 19–32)
Calcium: 9.9 mg/dL (ref 8.4–10.5)
Chloride: 107 mEq/L (ref 96–112)
Creatinine, Ser: 0.79 mg/dL (ref 0.40–1.20)
GFR: 80.43 mL/min (ref >60.00)
GLUCOSE: 101 mg/dL — AB (ref 70–99)
POTASSIUM: 5.1 meq/L (ref 3.5–5.1)
Sodium: 148 mEq/L — ABNORMAL HIGH (ref 135–145)
TOTAL PROTEIN: 7 g/dL (ref 6.0–8.3)
Total Bilirubin: 0.5 mg/dL (ref 0.2–1.2)

## 2015-04-25 LAB — LIPID PANEL
Cholesterol: 264 mg/dL — ABNORMAL HIGH (ref 0–200)
HDL: 104.6 mg/dL (ref >39.00)
LDL Cholesterol: 139 mg/dL — ABNORMAL HIGH (ref 0–99)
NonHDL: 159.02
Total CHOL/HDL Ratio: 3
Triglycerides: 102 mg/dL (ref 0.0–149.0)
VLDL: 20.4 mg/dL (ref 0.0–40.0)

## 2015-04-25 LAB — TSH: TSH: 1.55 u[IU]/mL (ref 0.35–4.50)

## 2015-04-25 NOTE — Assessment & Plan Note (Signed)
Tolerating statin, encouraged heart healthy diet, avoid trans fats, minimize simple carbs and saturated fats. Increase exercise as tolerated 

## 2015-04-25 NOTE — Progress Notes (Signed)
Subjective:    Patient ID: Veronica Aguilar, female    DOB: 05-02-1961, 54 y.o.   MRN: [id number]  Chief Complaint  Patient presents with  . Annual Exam    HPI Patient is in today for annual exam. Feeling well. She is grieving the loss of her mother earlier this year. No anhedonia or acute concerns. No recent acute injuries or illness. No migraines. Denies CP/palp/SOB/HA/congestion/fevers/GI or GU c/o. Taking meds as prescribed  Past Medical History  Diagnosis Date  . Chicken pox as a child  . Measles as a child  . Menstrual migraine     with aura  . Hyperlipidemia 08/19/2011  . Scoliosis 08/26/2011  . Preventative health care 08/26/2011  . UTI (lower urinary tract infection) 04/18/2012  . Menopause     Past Surgical History  Procedure Laterality Date  . Arthroscopic repair acl  2011    left    Family History  Problem Relation Age of Onset  . Cancer Mother 8    ovarian- remission  . Hyperlipidemia Mother   . Heart disease Father     valve replaced  . Glaucoma Father   . Heart disease Brother     stent  . Cancer Brother     melanoma  . Macular degeneration Maternal Grandmother   . Appendicitis Maternal Grandfather   . Cancer Paternal Grandfather     bone  . Depression Daughter     Social History   Social History  . Marital Status: Married    Spouse Name: N/A  . Number of Children: N/A  . Years of Education: N/A   Occupational History  . Not on file.   Social History Main Topics  . Smoking status: Never Smoker   . Smokeless tobacco: Never Used  . Alcohol Use: 4.2 - 8.4 oz/week    7-14 Glasses of wine per week  . Drug Use: No  . Sexual Activity:    Partners: Male    Birth Control/ Protection: Pill     Comment: Micronor   Other Topics Concern  . Not on file   Social History Narrative    Outpatient Prescriptions Prior to Visit  Medication Sig Dispense Refill  . CALCIUM PO Take by mouth daily.    . Omega-3 Fatty Acids (FISH OIL PO) Take by  mouth daily.    Marland Kitchen VITAMIN D, CHOLECALCIFEROL, PO Take 2 tablets by mouth daily.     No facility-administered medications prior to visit.    No Known Allergies  Review of Systems  Constitutional: Negative for fever, chills and malaise/fatigue.  HENT: Negative for congestion and hearing loss.   Eyes: Negative for discharge.  Respiratory: Negative for cough, sputum production and shortness of breath.   Cardiovascular: Negative for chest pain, palpitations and leg swelling.  Gastrointestinal: Negative for heartburn, nausea, vomiting, abdominal pain, diarrhea, constipation and blood in stool.  Genitourinary: Negative for dysuria, urgency, frequency and hematuria.  Musculoskeletal: Negative for myalgias, back pain and falls.  Skin: Negative for rash.  Neurological: Negative for dizziness, sensory change, loss of consciousness, weakness and headaches.  Endo/Heme/Allergies: Negative for environmental allergies. Does not bruise/bleed easily.  Psychiatric/Behavioral: Negative for depression and suicidal ideas. The patient is not nervous/anxious and does not have insomnia.        Objective:    Physical Exam  Constitutional: She is oriented to person, place, and time. She appears well-developed and well-nourished. No distress.  HENT:  Head: Normocephalic and atraumatic.  Eyes: Conjunctivae are normal.  Neck:  Neck supple. No thyromegaly present.  Cardiovascular: Normal rate, regular rhythm and normal heart sounds.   No murmur heard. Pulmonary/Chest: Effort normal and breath sounds normal. No respiratory distress.  Abdominal: Soft. Bowel sounds are normal. She exhibits no distension and no mass. There is no tenderness.  Musculoskeletal: She exhibits no edema.  Lymphadenopathy:    She has no cervical adenopathy.  Neurological: She is alert and oriented to person, place, and time.  Skin: Skin is warm and dry.  Psychiatric: She has a normal mood and affect. Her behavior is normal.    BP  102/62 mmHg  Pulse 81  Temp(Src) 97.6 F (36.4 C) (Oral)  Ht 5\' 3"  (1.6 m)  Wt 100 lb 2 oz (45.416 kg)  BMI 17.74 kg/m2  SpO2 97% Wt Readings from Last 3 Encounters:  04/25/15 100 lb 2 oz (45.416 kg)  11/13/14 111 lb 6.4 oz (50.531 kg)  08/13/13 118 lb (53.524 kg)     Lab Results  Component Value Date   WBC 5.3 08/24/2011   HGB 14.2 08/24/2011   HCT 41.9 08/24/2011   PLT 195.0 08/24/2011   GLUCOSE 88 08/24/2011   CHOL 243* 08/24/2011   TRIG 75.0 08/24/2011   HDL 84.10 08/24/2011   LDLDIRECT 141.6 08/24/2011   LDLCALC 108 11/05/1996   ALT 16 08/24/2011   AST 21 08/24/2011   NA 141 08/24/2011   K 4.4 08/24/2011   CL 107 08/24/2011   CREATININE 0.7 08/24/2011   BUN 14 08/24/2011   CO2 26 08/24/2011   TSH 0.96 08/24/2011    Lab Results  Component Value Date   TSH 0.96 08/24/2011   Lab Results  Component Value Date   WBC 5.3 08/24/2011   HGB 14.2 08/24/2011   HCT 41.9 08/24/2011   MCV 94.4 08/24/2011   PLT 195.0 08/24/2011   Lab Results  Component Value Date   NA 141 08/24/2011   K 4.4 08/24/2011   CO2 26 08/24/2011   GLUCOSE 88 08/24/2011   BUN 14 08/24/2011   CREATININE 0.7 08/24/2011   BILITOT 0.5 08/24/2011   ALKPHOS 41 08/24/2011   AST 21 08/24/2011   ALT 16 08/24/2011   PROT 6.5 08/24/2011   ALBUMIN 4.1 08/24/2011   ALBUMIN 4.1 08/24/2011   CALCIUM 8.8 08/24/2011   GFR 89.36 08/24/2011   Lab Results  Component Value Date   CHOL 243* 08/24/2011   Lab Results  Component Value Date   HDL 84.10 08/24/2011   Lab Results  Component Value Date   LDLCALC 108 11/05/1996   Lab Results  Component Value Date   TRIG 75.0 08/24/2011   Lab Results  Component Value Date   CHOLHDL 3 08/24/2011   No results found for: HGBA1C     Assessment & Plan:   Problem List Items Addressed This Visit    Preventative health care    Patient encouraged to maintain heart healthy diet, regular exercise, adequate sleep. Consider daily probiotics. Take  medications as prescribed. Labs ordered. Given and reviewed copy of ACP documents from [employer] and encouraged to complete and return. Follows with GYN      Relevant Orders   TSH   CBC   Comprehensive metabolic panel   Lipid panel   Menstrual migraine    No recent difficulties. Encouraged increased hydration, 64 ounces of clear fluids daily. Minimize alcohol and caffeine. Eat small frequent meals with lean proteins and complex carbs. Avoid high and low blood sugars. Get adequate sleep, 7-8  hours a night. Needs exercise daily preferably in the morning. Follows with GYN      Hyperlipidemia - Primary    Tolerating statin, encouraged heart healthy diet, avoid trans fats, minimize simple carbs and saturated fats. Increase exercise as tolerated      Relevant Orders   TSH   CBC   Comprehensive metabolic panel   Lipid panel      I am having Ms. Agent maintain her Omega-3 Fatty Acids (FISH OIL PO), CALCIUM PO, and (VITAMIN D, CHOLECALCIFEROL, PO).  No orders of the defined types were placed in this encounter.     Penni Homans, MD

## 2015-04-25 NOTE — Assessment & Plan Note (Addendum)
Patient encouraged to maintain heart healthy diet, regular exercise, adequate sleep. Consider daily probiotics. Take medications as prescribed. Labs ordered. Given and reviewed copy of ACP documents from [employer] and encouraged to complete and return. Follows with GYN

## 2015-04-25 NOTE — Assessment & Plan Note (Addendum)
No recent difficulties. Encouraged increased hydration, 64 ounces of clear fluids daily. Minimize alcohol and caffeine. Eat small frequent meals with lean proteins and complex carbs. Avoid high and low blood sugars. Get adequate sleep, 7-8 hours a night. Needs exercise daily preferably in the morning. Follows with GYN

## 2015-04-25 NOTE — Patient Instructions (Signed)
Preventive Care for Adults A healthy lifestyle and preventive care can promote health and wellness. Preventive health guidelines for women include the following key practices.  A routine yearly physical is a good way to check with your health care provider about your health and preventive screening. It is a chance to share any concerns and updates on your health and to receive a thorough exam.  Visit your dentist for a routine exam and preventive care every 6 months. Brush your teeth twice a day and floss once a day. Good oral hygiene prevents tooth decay and gum disease.  The frequency of eye exams is based on your age, health, family medical history, use of contact lenses, and other factors. Follow your health care provider's recommendations for frequency of eye exams.  Eat a healthy diet. Foods like vegetables, fruits, whole grains, low-fat dairy products, and lean protein foods contain the nutrients you need without too many calories. Decrease your intake of foods high in solid fats, added sugars, and salt. Eat the right amount of calories for you.Get information about a proper diet from your health care provider, if necessary.  Regular physical exercise is one of the most important things you can do for your health. Most adults should get at least 150 minutes of moderate-intensity exercise (any activity that increases your heart rate and causes you to sweat) each week. In addition, most adults need muscle-strengthening exercises on 2 or more days a week.  Maintain a healthy weight. The body mass index (BMI) is a screening tool to identify possible weight problems. It provides an estimate of body fat based on height and weight. Your health care provider can find your BMI and can help you achieve or maintain a healthy weight.For adults 20 years and older:  A BMI below 18.5 is considered underweight.  A BMI of 18.5 to 24.9 is normal.  A BMI of 25 to 29.9 is considered overweight.  A BMI of  30 and above is considered obese.  Maintain normal blood lipids and cholesterol levels by exercising and minimizing your intake of saturated fat. Eat a balanced diet with plenty of fruit and vegetables. Blood tests for lipids and cholesterol should begin at age 76 and be repeated every 5 years. If your lipid or cholesterol levels are high, you are over 50, or you are at high risk for heart disease, you may need your cholesterol levels checked more frequently.Ongoing high lipid and cholesterol levels should be treated with medicines if diet and exercise are not working.  If you smoke, find out from your health care provider how to quit. If you do not use tobacco, do not start.  Lung cancer screening is recommended for adults aged 22-80 years who are at high risk for developing lung cancer because of a history of smoking. A yearly low-dose CT scan of the lungs is recommended for people who have at least a 30-pack-year history of smoking and are a current smoker or have quit within the past 15 years. A pack year of smoking is smoking an average of 1 pack of cigarettes a day for 1 year (for example: 1 pack a day for 30 years or 2 packs a day for 15 years). Yearly screening should continue until the smoker has stopped smoking for at least 15 years. Yearly screening should be stopped for people who develop a health problem that would prevent them from having lung cancer treatment.  If you are pregnant, do not drink alcohol. If you are breastfeeding,  be very cautious about drinking alcohol. If you are not pregnant and choose to drink alcohol, do not have more than 1 drink per day. One drink is considered to be 12 ounces (355 mL) of beer, 5 ounces (148 mL) of wine, or 1.5 ounces (44 mL) of liquor.  Avoid use of street drugs. Do not share needles with anyone. Ask for help if you need support or instructions about stopping the use of drugs.  High blood pressure causes heart disease and increases the risk of  stroke. Your blood pressure should be checked at least every 1 to 2 years. Ongoing high blood pressure should be treated with medicines if weight loss and exercise do not work.  If you are 75-52 years old, ask your health care provider if you should take aspirin to prevent strokes.  Diabetes screening involves taking a blood sample to check your fasting blood sugar level. This should be done once every 3 years, after age 15, if you are within normal weight and without risk factors for diabetes. Testing should be considered at a younger age or be carried out more frequently if you are overweight and have at least 1 risk factor for diabetes.  Breast cancer screening is essential preventive care for women. You should practice "breast self-awareness." This means understanding the normal appearance and feel of your breasts and may include breast self-examination. Any changes detected, no matter how small, should be reported to a health care provider. Women in their 58s and 30s should have a clinical breast exam (CBE) by a health care provider as part of a regular health exam every 1 to 3 years. After age 16, women should have a CBE every year. Starting at age 53, women should consider having a mammogram (breast X-ray test) every year. Women who have a family history of breast cancer should talk to their health care provider about genetic screening. Women at a high risk of breast cancer should talk to their health care providers about having an MRI and a mammogram every year.  Breast cancer gene (BRCA)-related cancer risk assessment is recommended for women who have family members with BRCA-related cancers. BRCA-related cancers include breast, ovarian, tubal, and peritoneal cancers. Having family members with these cancers may be associated with an increased risk for harmful changes (mutations) in the breast cancer genes BRCA1 and BRCA2. Results of the assessment will determine the need for genetic counseling and  BRCA1 and BRCA2 testing.  Routine pelvic exams to screen for cancer are no longer recommended for nonpregnant women who are considered low risk for cancer of the pelvic organs (ovaries, uterus, and vagina) and who do not have symptoms. Ask your health care provider if a screening pelvic exam is right for you.  If you have had past treatment for cervical cancer or a condition that could lead to cancer, you need Pap tests and screening for cancer for at least 20 years after your treatment. If Pap tests have been discontinued, your risk factors (such as having a new sexual partner) need to be reassessed to determine if screening should be resumed. Some women have medical problems that increase the chance of getting cervical cancer. In these cases, your health care provider may recommend more frequent screening and Pap tests.  The HPV test is an additional test that may be used for cervical cancer screening. The HPV test looks for the virus that can cause the cell changes on the cervix. The cells collected during the Pap test can be  tested for HPV. The HPV test could be used to screen women aged 30 years and older, and should be used in women of any age who have unclear Pap test results. After the age of 30, women should have HPV testing at the same frequency as a Pap test.  Colorectal cancer can be detected and often prevented. Most routine colorectal cancer screening begins at the age of 50 years and continues through age 75 years. However, your health care provider may recommend screening at an earlier age if you have risk factors for colon cancer. On a yearly basis, your health care provider may provide home test kits to check for hidden blood in the stool. Use of a small camera at the end of a tube, to directly examine the colon (sigmoidoscopy or colonoscopy), can detect the earliest forms of colorectal cancer. Talk to your health care provider about this at age 50, when routine screening begins. Direct  exam of the colon should be repeated every 5-10 years through age 75 years, unless early forms of pre-cancerous polyps or small growths are found.  People who are at an increased risk for hepatitis B should be screened for this virus. You are considered at high risk for hepatitis B if:  You were born in a country where hepatitis B occurs often. Talk with your health care provider about which countries are considered high risk.  Your parents were born in a high-risk country and you have not received a shot to protect against hepatitis B (hepatitis B vaccine).  You have HIV or AIDS.  You use needles to inject street drugs.  You live with, or have sex with, someone who has hepatitis B.  You get hemodialysis treatment.  You take certain medicines for conditions like cancer, organ transplantation, and autoimmune conditions.  Hepatitis C blood testing is recommended for all people born from 1945 through 1965 and any individual with known risks for hepatitis C.  Practice safe sex. Use condoms and avoid high-risk sexual practices to reduce the spread of sexually transmitted infections (STIs). STIs include gonorrhea, chlamydia, syphilis, trichomonas, herpes, HPV, and human immunodeficiency virus (HIV). Herpes, HIV, and HPV are viral illnesses that have no cure. They can result in disability, cancer, and death.  You should be screened for sexually transmitted illnesses (STIs) including gonorrhea and chlamydia if:  You are sexually active and are younger than 24 years.  You are older than 24 years and your health care provider tells you that you are at risk for this type of infection.  Your sexual activity has changed since you were last screened and you are at an increased risk for chlamydia or gonorrhea. Ask your health care provider if you are at risk.  If you are at risk of being infected with HIV, it is recommended that you take a prescription medicine daily to prevent HIV infection. This is  called preexposure prophylaxis (PrEP). You are considered at risk if:  You are a heterosexual woman, are sexually active, and are at increased risk for HIV infection.  You take drugs by injection.  You are sexually active with a partner who has HIV.  Talk with your health care provider about whether you are at high risk of being infected with HIV. If you choose to begin PrEP, you should first be tested for HIV. You should then be tested every 3 months for as long as you are taking PrEP.  Osteoporosis is a disease in which the bones lose minerals and strength   with aging. This can result in serious bone fractures or breaks. The risk of osteoporosis can be identified using a bone density scan. Women ages 65 years and over and women at risk for fractures or osteoporosis should discuss screening with their health care providers. Ask your health care provider whether you should take a calcium supplement or vitamin D to reduce the rate of osteoporosis.  Menopause can be associated with physical symptoms and risks. Hormone replacement therapy is available to decrease symptoms and risks. You should talk to your health care provider about whether hormone replacement therapy is right for you.  Use sunscreen. Apply sunscreen liberally and repeatedly throughout the day. You should seek shade when your shadow is shorter than you. Protect yourself by wearing long sleeves, pants, a wide-brimmed hat, and sunglasses year round, whenever you are outdoors.  Once a month, do a whole body skin exam, using a mirror to look at the skin on your back. Tell your health care provider of new moles, moles that have irregular borders, moles that are larger than a pencil eraser, or moles that have changed in shape or color.  Stay current with required vaccines (immunizations).  Influenza vaccine. All adults should be immunized every year.  Tetanus, diphtheria, and acellular pertussis (Td, Tdap) vaccine. Pregnant women should  receive 1 dose of Tdap vaccine during each pregnancy. The dose should be obtained regardless of the length of time since the last dose. Immunization is preferred during the 27th-36th week of gestation. An adult who has not previously received Tdap or who does not know her vaccine status should receive 1 dose of Tdap. This initial dose should be followed by tetanus and diphtheria toxoids (Td) booster doses every 10 years. Adults with an unknown or incomplete history of completing a 3-dose immunization series with Td-containing vaccines should begin or complete a primary immunization series including a Tdap dose. Adults should receive a Td booster every 10 years.  Varicella vaccine. An adult without evidence of immunity to varicella should receive 2 doses or a second dose if she has previously received 1 dose. Pregnant females who do not have evidence of immunity should receive the first dose after pregnancy. This first dose should be obtained before leaving the health care facility. The second dose should be obtained 4-8 weeks after the first dose.  Human papillomavirus (HPV) vaccine. Females aged 13-26 years who have not received the vaccine previously should obtain the 3-dose series. The vaccine is not recommended for use in pregnant females. However, pregnancy testing is not needed before receiving a dose. If a female is found to be pregnant after receiving a dose, no treatment is needed. In that case, the remaining doses should be delayed until after the pregnancy. Immunization is recommended for any person with an immunocompromised condition through the age of 26 years if she did not get any or all doses earlier. During the 3-dose series, the second dose should be obtained 4-8 weeks after the first dose. The third dose should be obtained 24 weeks after the first dose and 16 weeks after the second dose.  Zoster vaccine. One dose is recommended for adults aged 60 years or older unless certain conditions are  present.  Measles, mumps, and rubella (MMR) vaccine. Adults born before 1957 generally are considered immune to measles and mumps. Adults born in 1957 or later should have 1 or more doses of MMR vaccine unless there is a contraindication to the vaccine or there is laboratory evidence of immunity to   each of the three diseases. A routine second dose of MMR vaccine should be obtained at least 28 days after the first dose for students attending postsecondary schools, health care workers, or international travelers. People who received inactivated measles vaccine or an unknown type of measles vaccine during 1963-1967 should receive 2 doses of MMR vaccine. People who received inactivated mumps vaccine or an unknown type of mumps vaccine before 1979 and are at high risk for mumps infection should consider immunization with 2 doses of MMR vaccine. For females of childbearing age, rubella immunity should be determined. If there is no evidence of immunity, females who are not pregnant should be vaccinated. If there is no evidence of immunity, females who are pregnant should delay immunization until after pregnancy. Unvaccinated health care workers born before 1957 who lack laboratory evidence of measles, mumps, or rubella immunity or laboratory confirmation of disease should consider measles and mumps immunization with 2 doses of MMR vaccine or rubella immunization with 1 dose of MMR vaccine.  Pneumococcal 13-valent conjugate (PCV13) vaccine. When indicated, a person who is uncertain of her immunization history and has no record of immunization should receive the PCV13 vaccine. An adult aged 19 years or older who has certain medical conditions and has not been previously immunized should receive 1 dose of PCV13 vaccine. This PCV13 should be followed with a dose of pneumococcal polysaccharide (PPSV23) vaccine. The PPSV23 vaccine dose should be obtained at least 8 weeks after the dose of PCV13 vaccine. An adult aged 19  years or older who has certain medical conditions and previously received 1 or more doses of PPSV23 vaccine should receive 1 dose of PCV13. The PCV13 vaccine dose should be obtained 1 or more years after the last PPSV23 vaccine dose.  Pneumococcal polysaccharide (PPSV23) vaccine. When PCV13 is also indicated, PCV13 should be obtained first. All adults aged 65 years and older should be immunized. An adult younger than age 65 years who has certain medical conditions should be immunized. Any person who resides in a nursing home or long-term care facility should be immunized. An adult smoker should be immunized. People with an immunocompromised condition and certain other conditions should receive both PCV13 and PPSV23 vaccines. People with human immunodeficiency virus (HIV) infection should be immunized as soon as possible after diagnosis. Immunization during chemotherapy or radiation therapy should be avoided. Routine use of PPSV23 vaccine is not recommended for American Indians, [place] Natives, or people younger than 65 years unless there are medical conditions that require PPSV23 vaccine. When indicated, people who have unknown immunization and have no record of immunization should receive PPSV23 vaccine. One-time revaccination 5 years after the first dose of PPSV23 is recommended for people aged 19-64 years who have chronic kidney failure, nephrotic syndrome, asplenia, or immunocompromised conditions. People who received 1-2 doses of PPSV23 before age 65 years should receive another dose of PPSV23 vaccine at age 65 years or later if at least 5 years have passed since the previous dose. Doses of PPSV23 are not needed for people immunized with PPSV23 at or after age 65 years.  Meningococcal vaccine. Adults with asplenia or persistent complement component deficiencies should receive 2 doses of quadrivalent meningococcal conjugate (MenACWY-D) vaccine. The doses should be obtained at least 2 months apart.  Microbiologists working with certain meningococcal bacteria, military recruits, people at risk during an outbreak, and people who travel to or live in countries with a high rate of meningitis should be immunized. A first-year college student up through age   21 years who is living in a residence hall should receive a dose if she did not receive a dose on or after her 16th birthday. Adults who have certain high-risk conditions should receive one or more doses of vaccine.  Hepatitis A vaccine. Adults who wish to be protected from this disease, have certain high-risk conditions, work with hepatitis A-infected animals, work in hepatitis A research labs, or travel to or work in countries with a high rate of hepatitis A should be immunized. Adults who were previously unvaccinated and who anticipate close contact with an international adoptee during the first 60 days after arrival in the [place] from a country with a high rate of hepatitis A should be immunized.  Hepatitis B vaccine. Adults who wish to be protected from this disease, have certain high-risk conditions, may be exposed to blood or other infectious body fluids, are household contacts or sex partners of hepatitis B positive people, are clients or workers in certain care facilities, or travel to or work in countries with a high rate of hepatitis B should be immunized.  Haemophilus influenzae type b (Hib) vaccine. A previously unvaccinated person with asplenia or sickle cell disease or having a scheduled splenectomy should receive 1 dose of Hib vaccine. Regardless of previous immunization, a recipient of a hematopoietic stem cell transplant should receive a 3-dose series 6-12 months after her successful transplant. Hib vaccine is not recommended for adults with HIV infection. Preventive Services / Frequency Ages 64 to 68 years  Blood pressure check.** / Every 1 to 2 years.  Lipid and cholesterol check.** / Every 5 years beginning at age  22.  Clinical breast exam.** / Every 3 years for women in their 88s and 53s.  BRCA-related cancer risk assessment.** / For women who have family members with a BRCA-related cancer (breast, ovarian, tubal, or peritoneal cancers).  Pap test.** / Every 2 years from ages 90 through 51. Every 3 years starting at age 21 through age 56 or 3 with a history of 3 consecutive normal Pap tests.  HPV screening.** / Every 3 years from ages 24 through ages 1 to 46 with a history of 3 consecutive normal Pap tests.  Hepatitis C blood test.** / For any individual with known risks for hepatitis C.  Skin self-exam. / Monthly.  Influenza vaccine. / Every year.  Tetanus, diphtheria, and acellular pertussis (Tdap, Td) vaccine.** / Consult your health care provider. Pregnant women should receive 1 dose of Tdap vaccine during each pregnancy. 1 dose of Td every 10 years.  Varicella vaccine.** / Consult your health care provider. Pregnant females who do not have evidence of immunity should receive the first dose after pregnancy.  HPV vaccine. / 3 doses over 6 months, if 72 and younger. The vaccine is not recommended for use in pregnant females. However, pregnancy testing is not needed before receiving a dose.  Measles, mumps, rubella (MMR) vaccine.** / You need at least 1 dose of MMR if you were born in 1957 or later. You may also need a 2nd dose. For females of childbearing age, rubella immunity should be determined. If there is no evidence of immunity, females who are not pregnant should be vaccinated. If there is no evidence of immunity, females who are pregnant should delay immunization until after pregnancy.  Pneumococcal 13-valent conjugate (PCV13) vaccine.** / Consult your health care provider.  Pneumococcal polysaccharide (PPSV23) vaccine.** / 1 to 2 doses if you smoke cigarettes or if you have certain conditions.  Meningococcal vaccine.** /  1 dose if you are age 19 to 21 years and a first-year college  student living in a residence hall, or have one of several medical conditions, you need to get vaccinated against meningococcal disease. You may also need additional booster doses.  Hepatitis A vaccine.** / Consult your health care provider.  Hepatitis B vaccine.** / Consult your health care provider.  Haemophilus influenzae type b (Hib) vaccine.** / Consult your health care provider. Ages 40 to 64 years  Blood pressure check.** / Every 1 to 2 years.  Lipid and cholesterol check.** / Every 5 years beginning at age 20 years.  Lung cancer screening. / Every year if you are aged 55-80 years and have a 30-pack-year history of smoking and currently smoke or have quit within the past 15 years. Yearly screening is stopped once you have quit smoking for at least 15 years or develop a health problem that would prevent you from having lung cancer treatment.  Clinical breast exam.** / Every year after age 40 years.  BRCA-related cancer risk assessment.** / For women who have family members with a BRCA-related cancer (breast, ovarian, tubal, or peritoneal cancers).  Mammogram.** / Every year beginning at age 40 years and continuing for as long as you are in good health. Consult with your health care provider.  Pap test.** / Every 3 years starting at age 30 years through age 65 or 70 years with a history of 3 consecutive normal Pap tests.  HPV screening.** / Every 3 years from ages 30 years through ages 65 to 70 years with a history of 3 consecutive normal Pap tests.  Fecal occult blood test (FOBT) of stool. / Every year beginning at age 50 years and continuing until age 75 years. You may not need to do this test if you get a colonoscopy every 10 years.  Flexible sigmoidoscopy or colonoscopy.** / Every 5 years for a flexible sigmoidoscopy or every 10 years for a colonoscopy beginning at age 50 years and continuing until age 75 years.  Hepatitis C blood test.** / For all people born from 1945 through  1965 and any individual with known risks for hepatitis C.  Skin self-exam. / Monthly.  Influenza vaccine. / Every year.  Tetanus, diphtheria, and acellular pertussis (Tdap/Td) vaccine.** / Consult your health care provider. Pregnant women should receive 1 dose of Tdap vaccine during each pregnancy. 1 dose of Td every 10 years.  Varicella vaccine.** / Consult your health care provider. Pregnant females who do not have evidence of immunity should receive the first dose after pregnancy.  Zoster vaccine.** / 1 dose for adults aged 60 years or older.  Measles, mumps, rubella (MMR) vaccine.** / You need at least 1 dose of MMR if you were born in 1957 or later. You may also need a 2nd dose. For females of childbearing age, rubella immunity should be determined. If there is no evidence of immunity, females who are not pregnant should be vaccinated. If there is no evidence of immunity, females who are pregnant should delay immunization until after pregnancy.  Pneumococcal 13-valent conjugate (PCV13) vaccine.** / Consult your health care provider.  Pneumococcal polysaccharide (PPSV23) vaccine.** / 1 to 2 doses if you smoke cigarettes or if you have certain conditions.  Meningococcal vaccine.** / Consult your health care provider.  Hepatitis A vaccine.** / Consult your health care provider.  Hepatitis B vaccine.** / Consult your health care provider.  Haemophilus influenzae type b (Hib) vaccine.** / Consult your health care provider. Ages 65   years and over  Blood pressure check.** / Every 1 to 2 years.  Lipid and cholesterol check.** / Every 5 years beginning at age 22 years.  Lung cancer screening. / Every year if you are aged 73-80 years and have a 30-pack-year history of smoking and currently smoke or have quit within the past 15 years. Yearly screening is stopped once you have quit smoking for at least 15 years or develop a health problem that would prevent you from having lung cancer  treatment.  Clinical breast exam.** / Every year after age 4 years.  BRCA-related cancer risk assessment.** / For women who have family members with a BRCA-related cancer (breast, ovarian, tubal, or peritoneal cancers).  Mammogram.** / Every year beginning at age 40 years and continuing for as long as you are in good health. Consult with your health care provider.  Pap test.** / Every 3 years starting at age 9 years through age 34 or 91 years with 3 consecutive normal Pap tests. Testing can be stopped between 65 and 70 years with 3 consecutive normal Pap tests and no abnormal Pap or HPV tests in the past 10 years.  HPV screening.** / Every 3 years from ages 57 years through ages 64 or 45 years with a history of 3 consecutive normal Pap tests. Testing can be stopped between 65 and 70 years with 3 consecutive normal Pap tests and no abnormal Pap or HPV tests in the past 10 years.  Fecal occult blood test (FOBT) of stool. / Every year beginning at age 15 years and continuing until age 17 years. You may not need to do this test if you get a colonoscopy every 10 years.  Flexible sigmoidoscopy or colonoscopy.** / Every 5 years for a flexible sigmoidoscopy or every 10 years for a colonoscopy beginning at age 86 years and continuing until age 71 years.  Hepatitis C blood test.** / For all people born from [birth year] through 1965 and any individual with known risks for hepatitis C.  Osteoporosis screening.** / A one-time screening for women ages 83 years and over and women at risk for fractures or osteoporosis.  Skin self-exam. / Monthly.  Influenza vaccine. / Every year.  Tetanus, diphtheria, and acellular pertussis (Tdap/Td) vaccine.** / 1 dose of Td every 10 years.  Varicella vaccine.** / Consult your health care provider.  Zoster vaccine.** / 1 dose for adults aged 61 years or older.  Pneumococcal 13-valent conjugate (PCV13) vaccine.** / Consult your health care provider.  Pneumococcal  polysaccharide (PPSV23) vaccine.** / 1 dose for all adults aged 28 years and older.  Meningococcal vaccine.** / Consult your health care provider.  Hepatitis A vaccine.** / Consult your health care provider.  Hepatitis B vaccine.** / Consult your health care provider.  Haemophilus influenzae type b (Hib) vaccine.** / Consult your health care provider. ** Family history and personal history of risk and conditions may change your health care provider's recommendations. Document Released: 09/07/2001 Document Revised: 11/26/2013 Document Reviewed: 12/07/2010 [facility] Patient Information 2015 [place], [place]. This information is not intended to replace advice given to you by your health care provider. Make sure you discuss any questions you have with your health care provider.

## 2015-04-25 NOTE — Progress Notes (Signed)
Pre visit review using our clinic review tool, if applicable. No additional management support is needed unless otherwise documented below in the visit note. 

## 2015-04-28 ENCOUNTER — Telehealth: Payer: Self-pay | Admitting: Emergency Medicine

## 2015-04-28 NOTE — Telephone Encounter (Signed)
Message left to return call to [place] at [phone]. At return call can speak with any nurse or Becky to schedule.   Needs to schedule Pelvic ultrasound and possible Endometrial biopsy.  Patient has 30 day letter and needs to schedule prior to 05/15/15.

## 2015-04-28 NOTE — Telephone Encounter (Signed)
Spoke with patient regarding evaluation for post menopausal bleeding with PUS, endometrial biopsy and possible SHGM. Patient was concerned this was not necessary. Discussed her lab values and the last known bleeding which was PMB. Discussed increase risk of endometrial cancer with post menopausal bleeding. Discussed with patient that PUS is done first and then it is up to the provider( Dr. Quincy Simmonds ) if endometrial biopsy indicated. Patient asked did I feel this was important and discussed my concerns for her health and importance.  Questions answered. Patient feels reassured now and will go forward with evaluation. Patient will be called to scheduled PUS and possible endometrial biopsy.

## 2015-04-28 NOTE — Telephone Encounter (Signed)
New telephone encounter created dated 04/28/15.  Message left to return call to [place] at [phone].

## 2015-04-28 NOTE — Telephone Encounter (Signed)
Letter was mailed certified and regular [place] mail on 04-17-15. Encounter closed.

## 2015-04-29 NOTE — Telephone Encounter (Signed)
Message left to return call to [place] at [phone].   Voicemail confirms "[place]

## 2015-05-09 NOTE — Telephone Encounter (Signed)
Patient returned call. She is agreeable to schedule Pelvic ultrasound at this time only. Discussed with Melvia Heaps CNM and states that Pelvic ultrasound is all that needs to be scheduled at this time and will base possible Endometrial biopsy or Sonohysterogram as needed based on results of Pelvic ultrasound. Ultrasound scheduled for 05/15/15 at 1130 with 1200 consult with Dr. Quincy Simmonds.   cc Kerry Hough for insurance pre-certification and patient contact.  Patient states she has been contacted prior regarding coverage, but would like update.   Routing to provider for final review. Patient agreeable to disposition. Will close encounter.    cc Melvia Heaps CNM  cc Lamont Snowball, RN as patient has 30 day letter.

## 2015-05-13 NOTE — Telephone Encounter (Signed)
Spoke with patient multiple times on 05/12/15 regarding benefits and scheduling for PUS. Patient aware must keep appointment as scheduled by 05/15/15 per 30 day letter received. Reviewed with clinical nurse who was agreeable. Patient understood at end of call and remained scheduled as of 05/12/15.  05/13/15 patient called to reschedule PUS. Reviewed with nurse who stated patient is aware of recommendations from letter. At this time, appointment cancelled with no further patient follow up as recommended.  Routing for review prior to closing.

## 2015-05-13 NOTE — Telephone Encounter (Signed)
Patient would like to reschedule her ultrasound appointment.

## 2015-05-14 NOTE — Telephone Encounter (Signed)
Sending to you for your awareness.  Patient has received a 30 day letter.

## 2015-05-15 ENCOUNTER — Other Ambulatory Visit: Payer: Managed Care, Other (non HMO)

## 2015-05-15 ENCOUNTER — Other Ambulatory Visit: Payer: Managed Care, Other (non HMO) | Admitting: Obstetrics and Gynecology

## 2015-05-15 ENCOUNTER — Ambulatory Visit (AMBULATORY_SURGERY_CENTER): Payer: Self-pay | Admitting: *Deleted

## 2015-05-15 VITALS — Ht 62.0 in | Wt 112.0 lb

## 2015-05-15 DIAGNOSIS — Z1211 Encounter for screening for malignant neoplasm of colon: Secondary | ICD-10-CM

## 2015-05-15 MED ORDER — NA SULFATE-K SULFATE-MG SULF 17.5-3.13-1.6 GM/177ML PO SOLN: 1.0000 | Freq: Once | ORAL | Status: DC

## 2015-05-15 NOTE — Addendum Note (Signed)
Addended by: [place] Bailiff D on: 05/15/2015 01:27 PM   Modules accepted: Orders

## 2015-05-15 NOTE — Progress Notes (Signed)
No egg or soy allergy. No anesthesia problems.  No home O2.  No diet meds.  

## 2015-05-20 ENCOUNTER — Telehealth: Payer: Self-pay | Admitting: *Deleted

## 2015-05-22 NOTE — Telephone Encounter (Signed)
Patient has not scheduled appointment after receiving 30 day letter. Patient discharged.  Encounter closed.

## 2015-05-29 ENCOUNTER — Encounter: Payer: Self-pay | Admitting: Gastroenterology

## 2015-05-29 ENCOUNTER — Ambulatory Visit (AMBULATORY_SURGERY_CENTER): Payer: Managed Care, Other (non HMO) | Admitting: Gastroenterology

## 2015-05-29 VITALS — BP 111/58 | HR 59 | Temp 97.3°F | Resp 11 | Ht 62.0 in | Wt 112.0 lb

## 2015-05-29 DIAGNOSIS — D120 Benign neoplasm of cecum: Secondary | ICD-10-CM | POA: Diagnosis not present

## 2015-05-29 DIAGNOSIS — D128 Benign neoplasm of rectum: Secondary | ICD-10-CM | POA: Diagnosis not present

## 2015-05-29 DIAGNOSIS — Z1211 Encounter for screening for malignant neoplasm of colon: Secondary | ICD-10-CM

## 2015-05-29 DIAGNOSIS — D122 Benign neoplasm of ascending colon: Secondary | ICD-10-CM | POA: Diagnosis not present

## 2015-05-29 DIAGNOSIS — D129 Benign neoplasm of anus and anal canal: Secondary | ICD-10-CM

## 2015-05-29 MED ORDER — SODIUM CHLORIDE 0.9 % IV SOLN: 500.0000 mL | INTRAVENOUS | Status: DC

## 2015-05-29 NOTE — Progress Notes (Signed)
Called to room to assist during endoscopic procedure.  Patient ID and intended procedure confirmed with present staff. Received instructions for my participation in the procedure from the performing physician.  

## 2015-05-29 NOTE — Progress Notes (Signed)
Transferred to recovery room. A/O x3, pleased with MAC.  VSS.  Report to Penny, RN. 

## 2015-05-29 NOTE — Patient Instructions (Signed)
YOU HAD AN ENDOSCOPIC PROCEDURE TODAY AT THE [facility]:   Refer to the procedure report that was given to you for any specific questions about what was found during the examination.  If the procedure report does not answer your questions, please call your gastroenterologist to clarify.  If you requested that your care partner not be given the details of your procedure findings, then the procedure report has been included in a sealed envelope for you to review at your convenience later.  YOU SHOULD EXPECT: Some feelings of bloating in the abdomen. Passage of more gas than usual.  Walking can help get rid of the air that was put into your GI tract during the procedure and reduce the bloating. If you had a lower endoscopy (such as a colonoscopy or flexible sigmoidoscopy) you may notice spotting of blood in your stool or on the toilet paper. If you underwent a bowel prep for your procedure, you may not have a normal bowel movement for a few days.  Please Note:  You might notice some irritation and congestion in your nose or some drainage.  This is from the oxygen used during your procedure.  There is no need for concern and it should clear up in a day or so.  SYMPTOMS TO REPORT IMMEDIATELY:   Following lower endoscopy (colonoscopy or flexible sigmoidoscopy):  Excessive amounts of blood in the stool  Significant tenderness or worsening of abdominal pains  Swelling of the abdomen that is new, acute  Fever of 100F or higher     For urgent or emergent issues, a gastroenterologist can be reached at any hour by calling [phone].   DIET: Your first meal following the procedure should be a small meal and then it is ok to progress to your normal diet. Heavy or fried foods are harder to digest and may make you feel nauseous or bloated.  Likewise, meals heavy in dairy and vegetables can increase bloating.  Drink plenty of fluids but you should avoid alcoholic beverages for 24  hours.  ACTIVITY:  You should plan to take it easy for the rest of today and you should NOT DRIVE or use heavy machinery until tomorrow (because of the sedation medicines used during the test).    FOLLOW UP: Our staff will call the number listed on your records the next business day following your procedure to check on you and address any questions or concerns that you may have regarding the information given to you following your procedure. If we do not reach you, we will leave a message.  However, if you are feeling well and you are not experiencing any problems, there is no need to return our call.  We will assume that you have returned to your regular daily activities without incident.  If any biopsies were taken you will be contacted by phone or by letter within the next 1-3 weeks.  Please call us at [phone] if you have not heard about the biopsies in 3 weeks.    SIGNATURES/CONFIDENTIALITY: You and/or your care partner have signed paperwork which will be entered into your electronic medical record.  These signatures attest to the fact that that the information above on your After Visit Summary has been reviewed and is understood.  Full responsibility of the confidentiality of this discharge information lies with you and/or your care-partner.    INFORMATION ON POLYPS,DIVERTICULOSIS,&HIGH FIBER DIET GIVEN TO YOU TODAY 

## 2015-05-29 NOTE — Op Note (Signed)
[facility]  [address] [place], [id number]   COLONOSCOPY PROCEDURE REPORT  PATIENT: Veronica Aguilar, Veronica Aguilar  MR#: [id number] BIRTHDATE: May 01, 1961 , 82  yrs. old GENDER: female ENDOSCOPIST: Yetta Flock, MD REFERRED BY: Penni Homans MD PROCEDURE DATE:  05/29/2015 PROCEDURE:   Colonoscopy, screening and Colonoscopy with biopsy First Screening Colonoscopy - Avg.  risk and is 50 yrs.  old or older Yes.  Prior Negative Screening - Now for repeat screening. N/A  History of Adenoma - Now for follow-up colonoscopy & has been > or = to 3 yrs.  N/A  Polyps removed today? Yes ASA CLASS:   Class II INDICATIONS:Screening for colonic neoplasia and Colorectal Neoplasm Risk Assessment for this procedure is average risk. MEDICATIONS: Propofol 350 mg IV  DESCRIPTION OF PROCEDURE:   After the risks benefits and alternatives of the procedure were thoroughly explained, informed consent was obtained.  The digital rectal exam revealed no abnormalities of the rectum.   The LB [id number]-[id number]  endoscope was introduced through the anus and advanced to the cecum, which was identified by both the appendix and ileocecal valve. No adverse events experienced.   The quality of the prep was adequate  The instrument was then slowly withdrawn as the colon was fully examined. Estimated blood loss is zero unless otherwise noted in this procedure report.  COLON FINDINGS: Two sessile polyps measuring 3 mm in size were found at the cecum.  Polypectomies were performed with cold forceps.  The resection was complete, the polyp tissue was completely retrieved and sent to histology.   Two sessile polyps measuring 3 mm in size were found in the ascending colon.  Polypectomies were performed with cold forceps.  The resection was complete, the polyp tissue was completely retrieved and sent to histology.   A sessile polyp measuring 3 mm in size was found in the rectum.   There was severe diverticulosis  noted in the left colon and transverse colon.   The examination was otherwise normal.  Retroflexed views revealed no abnormalities. The time to cecum = 5.5 Withdrawal time = 18.0   The scope was withdrawn and the procedure completed. COMPLICATIONS: There were no immediate complications.       ENDOSCOPIC IMPRESSION: 1.   Two sessile polyps were found at the cecum; polypectomies were performed with cold forceps 2.   Two sessile polyps were found in the ascending colon; polypectomies were performed with cold forceps 3.   Sessile polyp was found in the rectum 4.   Severe diverticulosis was noted in the left colon and transverse colon 5.   The examination was otherwise normal  RECOMMENDATIONS: 1.  Await pathology results 2.  Resume diet 3.  Resume medications  eSigned:  Yetta Flock, MD 05/29/2015 11:47 AM   cc: Willette Alma, MD , the patient   PATIENT NAME:  Veronica Aguilar, Veronica Aguilar MR#: [id number]

## 2015-05-30 ENCOUNTER — Telehealth: Payer: Self-pay | Admitting: *Deleted

## 2015-05-30 NOTE — Telephone Encounter (Signed)
  Follow up Call-  Call back number 05/29/2015  Post procedure Call Back phone  # [phone]  Permission to leave phone message Yes    [facility]

## 2015-06-01 ENCOUNTER — Encounter: Payer: Self-pay | Admitting: Gastroenterology

## 2015-07-15 ENCOUNTER — Telehealth: Payer: Self-pay | Admitting: Family Medicine

## 2015-07-15 MED ORDER — LORAZEPAM 0.5 MG PO TABS: 0.5000 mg | ORAL_TABLET | Freq: Two times a day (BID) | ORAL | Status: DC

## 2015-07-15 NOTE — Telephone Encounter (Signed)
Printed, PCP signed and faxed to [facility] in [place] on [address].

## 2015-07-15 NOTE — Telephone Encounter (Signed)
Caller name:Valjean Pooley Relationship to patient:self Can be reached:[phone]  Pharmacy:  Reason for call:Pt is requesting only to speak to MD, personal issues. Does not want to speak to anyone else

## 2015-07-15 NOTE — Telephone Encounter (Signed)
Patient struggling with significant difficulties with her young adult daughter. Patient is tearful but not a risk to anyone. Will call her in Lorazepma 0.5 mg tabs 1 tab po bid prn anxiety, insomnia. Disp #30 no rf

## 2015-10-20 ENCOUNTER — Telehealth: Payer: Self-pay | Admitting: Family Medicine

## 2015-10-20 DIAGNOSIS — Z1239 Encounter for other screening for malignant neoplasm of breast: Secondary | ICD-10-CM

## 2015-10-20 NOTE — Telephone Encounter (Signed)
Order placed and patient informed

## 2015-10-20 NOTE — Telephone Encounter (Signed)
Yes please order her a MM at [facility] for breast cancer screening

## 2015-10-20 NOTE — Telephone Encounter (Signed)
Pt wanting to get mammogram done at [facility]. Please enter order for her to be contacted to schedule. She states she is past due.

## 2015-10-20 NOTE — Telephone Encounter (Signed)
Advise if ok/due to order and I will take care of, thanks,

## 2015-11-04 ENCOUNTER — Telehealth: Payer: Self-pay | Admitting: Family Medicine

## 2015-11-04 DIAGNOSIS — Z7184 Encounter for health counseling related to travel: Secondary | ICD-10-CM

## 2015-11-04 NOTE — Telephone Encounter (Signed)
Caller name: Self  Can be reached: [phone]    Reason for call: Will be traveling to [place] next month and needs to know if she needs any vaccines

## 2015-11-04 NOTE — Telephone Encounter (Signed)
Please advise 

## 2015-11-04 NOTE — Telephone Encounter (Signed)
I would refer he to [facility]

## 2015-11-05 NOTE — Telephone Encounter (Signed)
Referral placed.

## 2015-11-10 ENCOUNTER — Telehealth: Payer: Self-pay | Admitting: Family Medicine

## 2015-11-10 NOTE — Telephone Encounter (Signed)
She is heading to [place] and is in need of Hepatitis A vaccine now and repeat in 6 months. Please arrange

## 2015-11-10 NOTE — Telephone Encounter (Signed)
Can be reached: [phone]   Reason for call: Pt called to see if we can do  Hep A vaccination. Please advise.

## 2015-11-10 NOTE — Telephone Encounter (Signed)
Pt says that she is returning your call in regards to vacc.   Please call back at: [phone]

## 2015-11-13 ENCOUNTER — Telehealth: Payer: Self-pay | Admitting: *Deleted

## 2015-11-13 ENCOUNTER — Ambulatory Visit (HOSPITAL_BASED_OUTPATIENT_CLINIC_OR_DEPARTMENT_OTHER)
Admission: RE | Admit: 2015-11-13 | Discharge: 2015-11-13 | Disposition: A | Discharge status: Home / Self Care | Payer: Managed Care, Other (non HMO) | Source: Ambulatory Visit | Attending: Family Medicine | Admitting: Family Medicine

## 2015-11-13 ENCOUNTER — Ambulatory Visit (INDEPENDENT_AMBULATORY_CARE_PROVIDER_SITE_OTHER): Payer: Managed Care, Other (non HMO) | Admitting: *Deleted

## 2015-11-13 DIAGNOSIS — Z1231 Encounter for screening mammogram for malignant neoplasm of breast: Secondary | ICD-10-CM | POA: Diagnosis not present

## 2015-11-13 DIAGNOSIS — Z23 Encounter for immunization: Secondary | ICD-10-CM | POA: Diagnosis not present

## 2015-11-13 DIAGNOSIS — Z1239 Encounter for other screening for malignant neoplasm of breast: Secondary | ICD-10-CM | POA: Insufficient documentation

## 2015-11-13 MED ORDER — CIPROFLOXACIN HCL 500 MG PO TABS: 500.0000 mg | ORAL_TABLET | Freq: Two times a day (BID) | ORAL | Status: DC

## 2015-11-13 NOTE — Telephone Encounter (Signed)
OK to write her a prescription for Ciprofloxacin 500 mg 1 tab po bid x 7 days

## 2015-11-13 NOTE — Telephone Encounter (Signed)
Sent in antibiotic and called the patient left a detailed message script sent in.

## 2015-11-13 NOTE — Telephone Encounter (Signed)
Pt in for nurse visit today for Hep A vaccine. She is traveling to [place] in a few weeks. She requested Cipro to use if needed for traveler's diarrhea. Please advise.   Pharmacy: [id number] - [place], [address] AT [place] OF [address]

## 2015-11-13 NOTE — Progress Notes (Signed)
Pre visit review using our clinic review tool, if applicable. No additional management support is needed unless otherwise documented below in the visit note.  Pt tolerated injection fairly well.   Next appt: 05/14/16  Dorrene German, RN

## 2015-11-19 ENCOUNTER — Ambulatory Visit: Payer: Managed Care, Other (non HMO) | Admitting: Obstetrics and Gynecology

## 2015-11-27 ENCOUNTER — Ambulatory Visit: Payer: Managed Care, Other (non HMO) | Admitting: Certified Nurse Midwife

## 2015-12-10 ENCOUNTER — Encounter: Payer: Self-pay | Admitting: Obstetrics & Gynecology

## 2015-12-10 ENCOUNTER — Ambulatory Visit (INDEPENDENT_AMBULATORY_CARE_PROVIDER_SITE_OTHER): Payer: Managed Care, Other (non HMO) | Admitting: Obstetrics & Gynecology

## 2015-12-10 VITALS — BP 112/65 | HR 94 | Ht 62.5 in | Wt 110.0 lb

## 2015-12-10 DIAGNOSIS — Z01419 Encounter for gynecological examination (general) (routine) without abnormal findings: Secondary | ICD-10-CM

## 2015-12-10 DIAGNOSIS — Z124 Encounter for screening for malignant neoplasm of cervix: Secondary | ICD-10-CM | POA: Diagnosis not present

## 2015-12-10 DIAGNOSIS — Z1151 Encounter for screening for human papillomavirus (HPV): Secondary | ICD-10-CM

## 2015-12-10 DIAGNOSIS — N915 Oligomenorrhea, unspecified: Secondary | ICD-10-CM

## 2015-12-10 NOTE — Progress Notes (Signed)
Patient ID: Veronica Aguilar, female   DOB: 09/28/1960, 55 y.o.   MRN: [id number] Subjective:     Veronica Aguilar is a 55 y.o. female here for a routine exam.  G#P2012.  LMP 06/2015.  Current complaints: none.  Pts mother with ovarian cancer at age 6yr. Deceased from age related illness.  Pt reports occ intercourse with condoms. Stopped OCP's this Spring.      Gynecologic History Patient's last menstrual period was 07/01/2015. Contraception: none Last Pap: 08/14/2013 Results were: normal Last mammogram: 11/14/2015. Results were: normal  Obstetric History OB History  Gravida Para Term Preterm AB SAB TAB Ectopic Multiple Living  3 2 2  1     2     # Outcome Date GA Lbr Len/2nd Weight Sex Delivery Anes PTL Lv  3 AB           2 Term     F Vag-Spont   Y  1 Term     F Vag-Spont   Y       The following portions of the patient's history were reviewed and updated as appropriate: allergies, current medications, past family history, past medical history, past social history, past surgical history and problem list.  Review of Systems Pertinent items are noted in HPI.    Objective:  BP 112/65 mmHg  Pulse 94  Ht 5' 2.5" (1.588 m)  Wt 110 lb (49.896 kg)  BMI 19.79 kg/m2  LMP 07/01/2015 General Appearance:    Alert, cooperative, no distress, appears stated age  Head:    Normocephalic, without obvious abnormality, atraumatic  Eyes:    conjunctiva/corneas clear, EOM's intact, both eyes  Ears:    Normal external ear canals, both ears  Nose:   Nares normal, septum midline, mucosa normal, no drainage    or sinus tenderness  Throat:   Lips, mucosa, and tongue normal; teeth and gums normal  Neck:   Supple, symmetrical, trachea midline, no adenopathy;    thyroid:  no enlargement/tenderness/nodules  Back:     Symmetric, no curvature, ROM normal, no CVA tenderness  Lungs:     Clear to auscultation bilaterally, respirations unlabored  Chest Wall:    No tenderness or deformity   Heart:    Regular rate  and rhythm, S1 and S2 normal, no murmur, rub   or gallop  Breast Exam:    No tenderness, masses, or nipple abnormality  Abdomen:     Soft, non-tender, bowel sounds active all four quadrants,    no masses, no organomegaly  Genitalia:    Normal female without lesion, discharge or tenderness     Extremities:   Extremities normal, atraumatic, no cyanosis or edema  Pulses:   2+ and symmetric all extremities  Skin:   Skin color, texture, turgor normal, no rashes or lesions     04/25/2015 TSH 1.55  [facility] 08/13/2013 5806 Assessment:    Healthy female exam.   Perimenopausal state   Plan:    Follow up in: 1 year.    F/u PAP with HPV Labs: [facility]

## 2015-12-10 NOTE — Progress Notes (Signed)
Patient states that hot flashes have increase in frequency in last two weeks. Kathrene Alu RNBSN

## 2015-12-10 NOTE — Patient Instructions (Signed)
Perimenopause Perimenopause is the time when your body begins to move into the menopause (no menstrual period for 12 straight months). It is a natural process. Perimenopause can begin 2-8 years before the menopause and usually lasts for 1 year after the menopause. During this time, your ovaries may or may not produce an egg. The ovaries vary in their production of estrogen and progesterone hormones each month. This can cause irregular menstrual periods, difficulty getting pregnant, vaginal bleeding between periods, and uncomfortable symptoms. CAUSES  Irregular production of the ovarian hormones, estrogen and progesterone, and not ovulating every month.  Other causes include:  Tumor of the pituitary gland in the brain.  Medical disease that affects the ovaries.  Radiation treatment.  Chemotherapy.  Unknown causes.  Heavy smoking and excessive alcohol intake can bring on perimenopause sooner. SIGNS AND SYMPTOMS   Hot flashes.  Night sweats.  Irregular menstrual periods.  Decreased sex drive.  Vaginal dryness.  Headaches.  Mood swings.  Depression.  Memory problems.  Irritability.  Tiredness.  Weight gain.  Trouble getting pregnant.  The beginning of losing bone cells (osteoporosis).  The beginning of hardening of the arteries (atherosclerosis). DIAGNOSIS  Your health care provider will make a diagnosis by analyzing your age, menstrual history, and symptoms. He or she will do a physical exam and note any changes in your body, especially your female organs. Female hormone tests may or may not be helpful depending on the amount of female hormones you produce and when you produce them. However, other hormone tests may be helpful to rule out other problems. TREATMENT  In some cases, no treatment is needed. The decision on whether treatment is necessary during the perimenopause should be made by you and your health care provider based on how the symptoms are affecting you  and your lifestyle. Various treatments are available, such as:  Treating individual symptoms with a specific medicine for that symptom.  Herbal medicines that can help specific symptoms.  Counseling.  Group therapy. HOME CARE INSTRUCTIONS   Keep track of your menstrual periods (when they occur, how heavy they are, how long between periods, and how long they last) as well as your symptoms and when they started.  Only take over-the-counter or prescription medicines as directed by your health care provider.  Sleep and rest.  Exercise.  Eat a diet that contains calcium (good for your bones) and soy (acts like the estrogen hormone).  Do not smoke.  Avoid alcoholic beverages.  Take vitamin supplements as recommended by your health care provider. Taking vitamin E may help in certain cases.  Take calcium and vitamin D supplements to help prevent bone loss.  Group therapy is sometimes helpful.  Acupuncture may help in some cases. SEEK MEDICAL CARE IF:   You have questions about any symptoms you are having.  You need a referral to a specialist (gynecologist, psychiatrist, or psychologist). SEEK IMMEDIATE MEDICAL CARE IF:   You have vaginal bleeding.  Your period lasts longer than 8 days.  Your periods are recurring sooner than 21 days.  You have bleeding after intercourse.  You have severe depression.  You have pain when you urinate.  You have severe headaches.  You have vision problems.   This information is not intended to replace advice given to you by your health care provider. Make sure you discuss any questions you have with your health care provider.   Document Released: 08/19/2004 Document Revised: 08/02/2014 Document Reviewed: 02/08/2013 Elsevier Interactive Patient Education [employer].  Menopause Menopause is the normal time of life when menstrual periods stop completely. Menopause is complete when you have missed 12 consecutive menstrual periods.  It usually occurs between the ages of 50 years and 55 years. Very rarely does a woman develop menopause before the age of 1 years. At menopause, your ovaries stop producing the female hormones estrogen and progesterone. This can cause undesirable symptoms and also affect your health. Sometimes the symptoms may occur 4-5 years before the menopause begins. There is no relationship between menopause and:  Oral contraceptives.  Number of children you had.  Race.  The age your menstrual periods started (menarche). Heavy smokers and very thin women may develop menopause earlier in life. CAUSES  The ovaries stop producing the female hormones estrogen and progesterone.  Other causes include:  Surgery to remove both ovaries.  The ovaries stop functioning for no known reason.  Tumors of the pituitary gland in the brain.  Medical disease that affects the ovaries and hormone production.  Radiation treatment to the abdomen or pelvis.  Chemotherapy that affects the ovaries. SYMPTOMS   Hot flashes.  Night sweats.  Decrease in sex drive.  Vaginal dryness and thinning of the vagina causing painful intercourse.  Dryness of the skin and developing wrinkles.  Headaches.  Tiredness.  Irritability.  Memory problems.  Weight gain.  Bladder infections.  Hair growth of the face and chest.  Infertility. More serious symptoms include:  Loss of bone (osteoporosis) causing breaks (fractures).  Depression.  Hardening and narrowing of the arteries (atherosclerosis) causing heart attacks and strokes. DIAGNOSIS   When the menstrual periods have stopped for 12 straight months.  Physical exam.  Hormone studies of the blood. TREATMENT  There are many treatment choices and nearly as many questions about them. The decisions to treat or not to treat menopausal changes is an individual choice made with your health care provider. Your health care provider can discuss the treatments with  you. Together, you can decide which treatment will work best for you. Your treatment choices may include:   Hormone therapy (estrogen and progesterone).  Non-hormonal medicines.  Treating the individual symptoms with medicine (for example antidepressants for depression).  Herbal medicines that may help specific symptoms.  Counseling by a psychiatrist or psychologist.  Group therapy.  Lifestyle changes including:  Eating healthy.  Regular exercise.  Limiting caffeine and alcohol.  Stress management and meditation.  No treatment. HOME CARE INSTRUCTIONS   Take the medicine your health care provider gives you as directed.  Get plenty of sleep and rest.  Exercise regularly.  Eat a diet that contains calcium (good for the bones) and soy products (acts like estrogen hormone).  Avoid alcoholic beverages.  Do not smoke.  If you have hot flashes, dress in layers.  Take supplements, calcium, and vitamin D to strengthen bones.  You can use over-the-counter lubricants or moisturizers for vaginal dryness.  Group therapy is sometimes very helpful.  Acupuncture may be helpful in some cases. SEEK MEDICAL CARE IF:   You are not sure you are in menopause.  You are having menopausal symptoms and need advice and treatment.  You are still having menstrual periods after age 54 years.  You have pain with intercourse.  Menopause is complete (no menstrual period for 12 months) and you develop vaginal bleeding.  You need a referral to a specialist (gynecologist, psychiatrist, or psychologist) for treatment. SEEK IMMEDIATE MEDICAL CARE IF:   You have severe depression.  You have excessive vaginal bleeding.  You fell and think you have a broken bone.  You have pain when you urinate.  You develop leg or chest pain.  You have a fast pounding heart beat (palpitations).  You have severe headaches.  You develop vision problems.  You feel a lump in your breast.  You have  abdominal pain or severe indigestion.   This information is not intended to replace advice given to you by your health care provider. Make sure you discuss any questions you have with your health care provider.   Document Released: 10/02/2003 Document Revised: 03/14/2013 Document Reviewed: 02/08/2013 Elsevier Interactive Patient Education [employer].

## 2015-12-11 ENCOUNTER — Telehealth: Payer: Self-pay

## 2015-12-11 LAB — FOLLICLE STIMULATING HORMONE: FSH: 76.7 m[IU]/mL

## 2015-12-11 NOTE — Telephone Encounter (Signed)
-----   Message from Lavonia Drafts, MD sent at 12/11/2015 10:22 AM EDT ----- Please call pt. Her levels are in the menopausal range and she does NOT need contraception.  Thx clh-S

## 2015-12-11 NOTE — Telephone Encounter (Signed)
Patient called and made aware that she is in the menopausal range and does not need contraception. Patient states understanding. Kathrene Alu RN BSN

## 2015-12-15 LAB — CYTOLOGY - PAP

## 2015-12-28 ENCOUNTER — Other Ambulatory Visit: Payer: Self-pay | Admitting: Family Medicine

## 2015-12-29 ENCOUNTER — Other Ambulatory Visit: Payer: Self-pay | Admitting: Family Medicine

## 2015-12-29 ENCOUNTER — Telehealth: Payer: Self-pay | Admitting: Emergency Medicine

## 2015-12-29 MED ORDER — LORAZEPAM 0.5 MG PO TABS: 0.5000 mg | ORAL_TABLET | Freq: Two times a day (BID) | ORAL | Status: DC

## 2015-12-29 NOTE — Telephone Encounter (Signed)
Faxed hardcopy for lorazepam to [facility] in [place]

## 2015-12-31 NOTE — Telephone Encounter (Signed)
Rx filled on 12/29/15

## 2016-03-19 ENCOUNTER — Encounter: Payer: Self-pay | Admitting: Family Medicine

## 2016-03-30 ENCOUNTER — Encounter: Payer: Self-pay | Admitting: Family Medicine

## 2016-03-30 DIAGNOSIS — M25562 Pain in left knee: Secondary | ICD-10-CM

## 2016-04-05 ENCOUNTER — Ambulatory Visit: Payer: Managed Care, Other (non HMO) | Admitting: Family Medicine

## 2016-04-07 ENCOUNTER — Ambulatory Visit: Payer: Managed Care, Other (non HMO) | Admitting: Family Medicine

## 2016-04-07 ENCOUNTER — Encounter: Payer: Self-pay | Admitting: Family Medicine

## 2016-04-07 ENCOUNTER — Ambulatory Visit (INDEPENDENT_AMBULATORY_CARE_PROVIDER_SITE_OTHER): Payer: Managed Care, Other (non HMO) | Admitting: Family Medicine

## 2016-04-07 VITALS — BP 105/71 | HR 72 | Ht 62.0 in | Wt 112.0 lb

## 2016-04-07 DIAGNOSIS — S8992XA Unspecified injury of left lower leg, initial encounter: Secondary | ICD-10-CM

## 2016-04-07 DIAGNOSIS — M25562 Pain in left knee: Secondary | ICD-10-CM | POA: Diagnosis not present

## 2016-04-07 NOTE — Patient Instructions (Signed)
I'm concerned you at least sprained (partially tore) your ACL. The rest of your testing is completely normal. You should do well with conservative treatment. Start physical therapy and do home exercises on days you don't go to therapy (you could go 1-2 times a week or just for 1-3 total visits and do the stuff on your own). Icing, compression as needed for swelling. Ibuprofen or aleve as needed for pain and inflammation. Follow up with me in 6 weeks for reevaluation (likely last visit unless you're having problems).

## 2016-04-12 DIAGNOSIS — M1712 Unilateral primary osteoarthritis, left knee: Secondary | ICD-10-CM | POA: Insufficient documentation

## 2016-04-12 HISTORY — DX: Unilateral primary osteoarthritis, left knee: M17.12

## 2016-04-12 NOTE — Assessment & Plan Note (Signed)
concerning for at least partial ACL tear.  She states she does not want to have surgery again for this.  We will start with physical therapy, icing, compression.  Ibuprofen or aleve.  F/u in 6 weeks for reevaluation.

## 2016-04-12 NOTE — Progress Notes (Signed)
PCP: Penni Homans, MD Consultation requested by: Elyn Aquas [facility]  Subjective:   HPI: Patient is a 55 y.o. female here for left knee injury.  Patient reports on 7/2 she was dancing - had left foot planted and turned, felt like knee gave out and shifted. + swelling, soreness. Pain level is 2/10 anterior knee. Doing stationary bike and walking - some worsening pain with walking. No skin changes, numbness. Had ACL reconstructed in 2011 this knee.  Past Medical History:  Diagnosis Date  . Chicken pox as a child  . Hyperlipidemia 08/19/2011  . Measles as a child  . Menopause   . Menstrual migraine    with aura  . Preventative health care 08/26/2011  . Scoliosis 08/26/2011  . UTI (lower urinary tract infection) 04/18/2012    Current Outpatient Prescriptions on File Prior to Visit  Medication Sig Dispense Refill  . Calcium-Magnesium-Vitamin D 185-50-100 MG-MG-UNIT CAPS Take by mouth.    . ciprofloxacin (CIPRO) 500 MG tablet Take 1 tablet (500 mg total) by mouth 2 (two) times daily. Take for 7 days (Patient not taking: Reported on 12/10/2015) 14 tablet 0  . KRILL OIL PO Take by mouth.    Marland Kitchen LORazepam (ATIVAN) 0.5 MG tablet Take 1 tablet (0.5 mg total) by mouth 2 (two) times daily. 30 tablet 0  . VITAMIN D, CHOLECALCIFEROL, PO Take 2 tablets by mouth daily.     No current facility-administered medications on file prior to visit.     Past Surgical History:  Procedure Laterality Date  . ARTHROSCOPIC REPAIR ACL  2011   left    No Known Allergies  Social History   Social History  . Marital status: Married    Spouse name: N/A  . Number of children: N/A  . Years of education: N/A   Occupational History  . Not on file.   Social History Main Topics  . Smoking status: Never Smoker  . Smokeless tobacco: Never Used  . Alcohol use 4.2 - 8.4 oz/week    7 - 14 Glasses of wine per week  . Drug use: No  . Sexual activity: Yes    Partners: Male    Birth control/ protection: Pill    Comment: Micronor   Other Topics Concern  . Not on file   Social History Narrative  . No narrative on file    Family History  Problem Relation Age of Onset  . Cancer Mother 27    ovarian- remission  . Hyperlipidemia Mother   . Heart disease Father     valve replaced  . Glaucoma Father   . Heart disease Brother     stent  . Cancer Brother     melanoma  . Macular degeneration Maternal Grandmother   . Appendicitis Maternal Grandfather   . Cancer Paternal Grandfather     bone  . Depression Daughter   . Colon cancer Neg Hx     BP 105/71   Pulse 72   Ht 5\' 2"  (1.575 m)   Wt 112 lb (50.8 kg)   BMI 20.49 kg/m   Review of Systems: See HPI above.    Objective:  Physical Exam:  Gen: NAD, comfortable in exam room  Left knee: Mild effusion.  No bruising, other deformity. No TTP. FROM. 1+ ant drawer compared to right knee.  1+ lachmanns.  Negative post drawer. Negative valgus/varus testing. Negative lachmanns. Negative mcmurrays, apleys, patellar apprehension. NV intact distally.  Right knee: FROM without pain.    Assessment & Plan:  1. Left knee injury - concerning for at least partial ACL tear.  She states she does not want to have surgery again for this.  We will start with physical therapy, icing, compression.  Ibuprofen or aleve.  F/u in 6 weeks for reevaluation.

## 2016-04-26 ENCOUNTER — Ambulatory Visit (INDEPENDENT_AMBULATORY_CARE_PROVIDER_SITE_OTHER): Payer: Managed Care, Other (non HMO) | Admitting: Family Medicine

## 2016-04-26 ENCOUNTER — Encounter: Payer: Self-pay | Admitting: Family Medicine

## 2016-04-26 DIAGNOSIS — L989 Disorder of the skin and subcutaneous tissue, unspecified: Secondary | ICD-10-CM

## 2016-04-26 DIAGNOSIS — Z0000 Encounter for general adult medical examination without abnormal findings: Secondary | ICD-10-CM

## 2016-04-26 DIAGNOSIS — E785 Hyperlipidemia, unspecified: Secondary | ICD-10-CM

## 2016-04-26 DIAGNOSIS — S8992XA Unspecified injury of left lower leg, initial encounter: Secondary | ICD-10-CM | POA: Diagnosis not present

## 2016-04-26 LAB — CBC
HEMATOCRIT: 40.9 % (ref 36.0–46.0)
HEMOGLOBIN: 13.9 g/dL (ref 12.0–15.0)
MCHC: 34 g/dL (ref 30.0–36.0)
MCV: 88.8 fl (ref 78.0–100.0)
Platelets: 221 10*3/uL (ref 150.0–400.0)
RBC: 4.61 Mil/uL (ref 3.87–5.11)
RDW: 14.6 % (ref 11.5–15.5)
WBC: 4.2 10*3/uL (ref 4.0–10.5)

## 2016-04-26 LAB — COMPREHENSIVE METABOLIC PANEL
ALK PHOS: 60 U/L (ref 39–117)
ALT: 16 U/L (ref 0–35)
AST: 19 U/L (ref 0–37)
Albumin: 4.2 g/dL (ref 3.5–5.2)
BILIRUBIN TOTAL: 0.6 mg/dL (ref 0.2–1.2)
BUN: 10 mg/dL (ref 6–23)
CO2: 30 meq/L (ref 19–32)
CREATININE: 0.68 mg/dL (ref 0.40–1.20)
Calcium: 9.4 mg/dL (ref 8.4–10.5)
Chloride: 103 mEq/L (ref 96–112)
GFR: 95.27 mL/min (ref >60.00)
GLUCOSE: 82 mg/dL (ref 70–99)
Potassium: 3.9 mEq/L (ref 3.5–5.1)
Sodium: 140 mEq/L (ref 135–145)
TOTAL PROTEIN: 6.8 g/dL (ref 6.0–8.3)

## 2016-04-26 LAB — LIPID PANEL
CHOL/HDL RATIO: 3
Cholesterol: 260 mg/dL — ABNORMAL HIGH (ref 0–200)
HDL: 101.5 mg/dL (ref >39.00)
LDL Cholesterol: 137 mg/dL — ABNORMAL HIGH (ref 0–99)
NONHDL: 158.05
TRIGLYCERIDES: 106 mg/dL (ref 0.0–149.0)
VLDL: 21.2 mg/dL (ref 0.0–40.0)

## 2016-04-26 LAB — TSH: TSH: 1.39 u[IU]/mL (ref 0.35–4.50)

## 2016-04-26 NOTE — Assessment & Plan Note (Signed)
Patient encouraged to maintain heart healthy diet, regular exercise, adequate sleep. Consider daily probiotics. Take medications as prescribed. Given and reviewed copy of ACP documents from [place] Secretary of State and encouraged to complete and return 

## 2016-04-26 NOTE — Assessment & Plan Note (Signed)
Tolerating statin, encouraged heart healthy diet, avoid trans fats, minimize simple carbs and saturated fats. Increase exercise as tolerated 

## 2016-04-26 NOTE — Progress Notes (Signed)
Patient ID: Veronica Aguilar, female   DOB: 05/25/1961, 55 y.o.   MRN: [id number]   Subjective:    Patient ID: Veronica Aguilar, female    DOB: July 27, 1960, 55 y.o.   MRN: [id number]  Chief Complaint  Patient presents with  . Abdominal Pain    HPI Patient is in today for annual preventative exam and follow up. After a recent injury to left knee and treatment by sports med her knee is better. No recent illness or hospitalization. She does note occasional abdominal discomfort but no pattern of change in bowels or bladder. She has been struggling with increased stress due to her young adult daughter's depression, unintended pregnancy which actually ended due to a congenital anomaly etc. She feels she is managing well all things considered and her daughter is improving. Denies CP/palp/SOB/HA/congestion/fevers/GI or GU c/o. Taking meds as prescribed  Past Medical History:  Diagnosis Date  . Chicken pox as a child  . Hyperlipidemia 08/19/2011  . Measles as a child  . Menopause   . Menstrual migraine    with aura  . Preventative health care 08/26/2011  . Scoliosis 08/26/2011  . UTI (lower urinary tract infection) 04/18/2012    Past Surgical History:  Procedure Laterality Date  . ARTHROSCOPIC REPAIR ACL  2011   left    Family History  Problem Relation Age of Onset  . Cancer Mother 94    ovarian- remission  . Hyperlipidemia Mother   . Heart disease Father     valve replaced  . Glaucoma Father   . Heart disease Brother     stent  . Cancer Brother     melanoma  . Hyperlipidemia Brother   . Macular degeneration Maternal Grandmother   . Appendicitis Maternal Grandfather   . Cancer Paternal Grandfather     bone  . Depression Daughter   . Hyperlipidemia Brother   . Hyperlipidemia Brother   . Hyperlipidemia Sister   . Colon cancer Neg Hx     Social History   Social History  . Marital status: Married    Spouse name: N/A  . Number of children: N/A  . Years of education: N/A    Occupational History  . Not on file.   Social History Main Topics  . Smoking status: Never Smoker  . Smokeless tobacco: Never Used  . Alcohol use 4.2 - 8.4 oz/week    7 - 14 Glasses of wine per week  . Drug use: No  . Sexual activity: Yes    Partners: Male    Birth control/ protection: Pill     Comment: Micronor   Other Topics Concern  . Not on file   Social History Narrative  . No narrative on file    Outpatient Medications Prior to Visit  Medication Sig Dispense Refill  . Calcium-Magnesium-Vitamin D 185-50-100 MG-MG-UNIT CAPS Take by mouth.    Marland Kitchen KRILL OIL PO Take by mouth.    Marland Kitchen VITAMIN D, CHOLECALCIFEROL, PO Take 2 tablets by mouth daily.    Marland Kitchen LORazepam (ATIVAN) 0.5 MG tablet Take 1 tablet (0.5 mg total) by mouth 2 (two) times daily. (Patient not taking: Reported on 04/26/2016) 30 tablet 0  . ciprofloxacin (CIPRO) 500 MG tablet Take 1 tablet (500 mg total) by mouth 2 (two) times daily. Take for 7 days (Patient not taking: Reported on 04/26/2016) 14 tablet 0   No facility-administered medications prior to visit.     No Known Allergies  Review of Systems  Constitutional: Negative for chills, fever  and malaise/fatigue.  HENT: Negative for congestion and hearing loss.   Eyes: Negative for discharge.  Respiratory: Negative for cough, sputum production and shortness of breath.   Cardiovascular: Negative for chest pain, palpitations and leg swelling.  Gastrointestinal: Negative for abdominal pain, blood in stool, constipation, diarrhea, heartburn, nausea and vomiting.  Genitourinary: Negative for dysuria, frequency, hematuria and urgency.  Musculoskeletal: Negative for back pain, falls and myalgias.  Skin: Negative for rash.  Neurological: Negative for dizziness, sensory change, loss of consciousness, weakness and headaches.  Endo/Heme/Allergies: Negative for environmental allergies. Does not bruise/bleed easily.  Psychiatric/Behavioral: Negative for depression and suicidal  ideas. The patient is nervous/anxious. The patient does not have insomnia.        Objective:    Physical Exam  Constitutional: She is oriented to person, place, and time. She appears well-developed and well-nourished. No distress.  HENT:  Head: Normocephalic and atraumatic.  Eyes: Conjunctivae are normal.  Neck: Neck supple. No thyromegaly present.  Cardiovascular: Normal rate, regular rhythm and normal heart sounds.   No murmur heard. Pulmonary/Chest: Effort normal and breath sounds normal. No respiratory distress.  Abdominal: Soft. Bowel sounds are normal. She exhibits no distension and no mass. There is no tenderness.  Musculoskeletal: She exhibits no edema.  Lymphadenopathy:    She has no cervical adenopathy.  Neurological: She is alert and oriented to person, place, and time.  Skin: Skin is warm and dry.  Psychiatric: She has a normal mood and affect. Her behavior is normal.    BP 104/69 (BP Location: Right Arm, Patient Position: Sitting, Cuff Size: Normal)   Pulse 60   Temp 97.9 F (36.6 C) (Oral)   Ht 5' 2.5" (1.588 m)   Wt 114 lb 12.8 oz (52.1 kg)   SpO2 100%   BMI 20.66 kg/m  Wt Readings from Last 3 Encounters:  04/26/16 114 lb 12.8 oz (52.1 kg)  04/07/16 112 lb (50.8 kg)  12/10/15 110 lb (49.9 kg)     Lab Results  Component Value Date   WBC 4.8 04/25/2015   HGB 14.2 04/25/2015   HCT 43.5 04/25/2015   PLT 229.0 04/25/2015   GLUCOSE 101 (H) 04/25/2015   CHOL 264 (H) 04/25/2015   TRIG 102.0 04/25/2015   HDL 104.60 04/25/2015   LDLDIRECT 141.6 08/24/2011   LDLCALC 139 (H) 04/25/2015   ALT 22 04/25/2015   AST 21 04/25/2015   NA 148 (H) 04/25/2015   K 5.1 04/25/2015   CL 107 04/25/2015   CREATININE 0.79 04/25/2015   BUN 11 04/25/2015   CO2 31 04/25/2015   TSH 1.55 04/25/2015    Lab Results  Component Value Date   TSH 1.55 04/25/2015   Lab Results  Component Value Date   WBC 4.8 04/25/2015   HGB 14.2 04/25/2015   HCT 43.5 04/25/2015   MCV  91.4 04/25/2015   PLT 229.0 04/25/2015   Lab Results  Component Value Date   NA 148 (H) 04/25/2015   K 5.1 04/25/2015   CO2 31 04/25/2015   GLUCOSE 101 (H) 04/25/2015   BUN 11 04/25/2015   CREATININE 0.79 04/25/2015   BILITOT 0.5 04/25/2015   ALKPHOS 70 04/25/2015   AST 21 04/25/2015   ALT 22 04/25/2015   PROT 7.0 04/25/2015   ALBUMIN 4.5 04/25/2015   CALCIUM 9.9 04/25/2015   GFR 80.43 04/25/2015   Lab Results  Component Value Date   CHOL 264 (H) 04/25/2015   Lab Results  Component Value Date   HDL 104.60  04/25/2015   Lab Results  Component Value Date   LDLCALC 139 (H) 04/25/2015   Lab Results  Component Value Date   TRIG 102.0 04/25/2015   Lab Results  Component Value Date   CHOLHDL 3 04/25/2015   No results found for: HGBA1C     Assessment & Plan:   Problem List Items Addressed This Visit    Hyperlipidemia    Tolerating statin, encouraged heart healthy diet, avoid trans fats, minimize simple carbs and saturated fats. Increase exercise as tolerated      Relevant Orders   Lipid panel   Skin lesion   Preventative health care    Patient encouraged to maintain heart healthy diet, regular exercise, adequate sleep. Consider daily probiotics. Take medications as prescribed. Given and reviewed copy of ACP documents from [place] of State and encouraged to complete and return      Relevant Orders   TSH   Comprehensive metabolic panel   CBC   Left knee injury    Pain resolved, will notify us if returns. Consider topical treatments       Other Visit Diagnoses   None.     I have discontinued Ms. Yamaguchi's ciprofloxacin. I am also having her maintain her (VITAMIN D, CHOLECALCIFEROL, PO), Calcium-Magnesium-Vitamin D, KRILL OIL PO, and LORazepam.  No orders of the defined types were placed in this encounter.    Penni Homans, MD

## 2016-04-26 NOTE — Progress Notes (Signed)
Pre visit review using our clinic review tool, if applicable. No additional management support is needed unless otherwise documented below in the visit note. 

## 2016-04-26 NOTE — Assessment & Plan Note (Addendum)
Pain resolved, will notify us if returns. Consider topical treatments

## 2016-04-26 NOTE — Patient Instructions (Signed)
Red Ye[employer] Rice for cholesterol, [employer] company supplement, [email or web address]  Preventive Care for Adults, Female A healthy lifestyle and preventive care can promote health and wellness. Preventive health guidelines for women include the following key practices.  A routine yearly physical is a good way to check with your health care provider about your health and preventive screening. It is a chance to share any concerns and updates on your health and to receive a thorough exam.  Visit your dentist for a routine exam and preventive care every 6 months. Brush your teeth twice a day and floss once a day. Good oral hygiene prevents tooth decay and gum disease.  The frequency of eye exams is based on your age, health, family medical history, use of contact lenses, and other factors. Follow your health care provider's recommendations for frequency of eye exams.  Eat a healthy diet. Foods like vegetables, fruits, whole grains, low-fat dairy products, and lean protein foods contain the nutrients you need without too many calories. Decrease your intake of foods high in solid fats, added sugars, and salt. Eat the right amount of calories for you.Get information about a proper diet from your health care provider, if necessary.  Regular physical exercise is one of the most important things you can do for your health. Most adults should get at least 150 minutes of moderate-intensity exercise (any activity that increases your heart rate and causes you to sweat) each week. In addition, most adults need muscle-strengthening exercises on 2 or more days a week.  Maintain a healthy weight. The body mass index (BMI) is a screening tool to identify possible weight problems. It provides an estimate of body fat based on height and weight. Your health care provider can find your BMI and can help you achieve or maintain a healthy weight.For adults 20 years and older:  A BMI below 18.5 is considered underweight.  A BMI of  18.5 to 24.9 is normal.  A BMI of 25 to 29.9 is considered overweight.  A BMI of 30 and above is considered obese.  Maintain normal blood lipids and cholesterol levels by exercising and minimizing your intake of saturated fat. Eat a balanced diet with plenty of fruit and vegetables. Blood tests for lipids and cholesterol should begin at age 60 and be repeated every 5 years. If your lipid or cholesterol levels are high, you are over 50, or you are at high risk for heart disease, you may need your cholesterol levels checked more frequently.Ongoing high lipid and cholesterol levels should be treated with medicines if diet and exercise are not working.  If you smoke, find out from your health care provider how to quit. If you do not use tobacco, do not start.  Lung cancer screening is recommended for adults aged 45-80 years who are at high risk for developing lung cancer because of a history of smoking. A yearly low-dose CT scan of the lungs is recommended for people who have at least a 30-pack-year history of smoking and are a current smoker or have quit within the past 15 years. A pack year of smoking is smoking an average of 1 pack of cigarettes a day for 1 year (for example: 1 pack a day for 30 years or 2 packs a day for 15 years). Yearly screening should continue until the smoker has stopped smoking for at least 15 years. Yearly screening should be stopped for people who develop a health problem that would prevent them from having lung cancer treatment.  If  you are pregnant, do not drink alcohol. If you are breastfeeding, be very cautious about drinking alcohol. If you are not pregnant and choose to drink alcohol, do not have more than 1 drink per day. One drink is considered to be 12 ounces (355 mL) of beer, 5 ounces (148 mL) of wine, or 1.5 ounces (44 mL) of liquor.  Avoid use of street drugs. Do not share needles with anyone. Ask for help if you need support or instructions about stopping the use  of drugs.  High blood pressure causes heart disease and increases the risk of stroke. Your blood pressure should be checked at least every 1 to 2 years. Ongoing high blood pressure should be treated with medicines if weight loss and exercise do not work.  If you are 57-69 years old, ask your health care provider if you should take aspirin to prevent strokes.  Diabetes screening is done by taking a blood sample to check your blood glucose level after you have not eaten for a certain period of time (fasting). If you are not overweight and you do not have risk factors for diabetes, you should be screened once every 3 years starting at age 81. If you are overweight or obese and you are 62-74 years of age, you should be screened for diabetes every year as part of your cardiovascular risk assessment.  Breast cancer screening is essential preventive care for women. You should practice "breast self-awareness." This means understanding the normal appearance and feel of your breasts and may include breast self-examination. Any changes detected, no matter how small, should be reported to a health care provider. Women in their 28s and 30s should have a clinical breast exam (CBE) by a health care provider as part of a regular health exam every 1 to 3 years. After age 25, women should have a CBE every year. Starting at age 89, women should consider having a mammogram (breast X-ray test) every year. Women who have a family history of breast cancer should talk to their health care provider about genetic screening. Women at a high risk of breast cancer should talk to their health care providers about having an MRI and a mammogram every year.  Breast cancer gene (BRCA)-related cancer risk assessment is recommended for women who have family members with BRCA-related cancers. BRCA-related cancers include breast, ovarian, tubal, and peritoneal cancers. Having family members with these cancers may be associated with an  increased risk for harmful changes (mutations) in the breast cancer genes BRCA1 and BRCA2. Results of the assessment will determine the need for genetic counseling and BRCA1 and BRCA2 testing.  Your health care provider may recommend that you be screened regularly for cancer of the pelvic organs (ovaries, uterus, and vagina). This screening involves a pelvic examination, including checking for microscopic changes to the surface of your cervix (Pap test). You may be encouraged to have this screening done every 3 years, beginning at age 43.  For women ages 34-65, health care providers may recommend pelvic exams and Pap testing every 3 years, or they may recommend the Pap and pelvic exam, combined with testing for human papilloma virus (HPV), every 5 years. Some types of HPV increase your risk of cervical cancer. Testing for HPV may also be done on women of any age with unclear Pap test results.  Other health care providers may not recommend any screening for nonpregnant women who are considered low risk for pelvic cancer and who do not have symptoms. Ask your health  care provider if a screening pelvic exam is right for you.  If you have had past treatment for cervical cancer or a condition that could lead to cancer, you need Pap tests and screening for cancer for at least 20 years after your treatment. If Pap tests have been discontinued, your risk factors (such as having a new sexual partner) need to be reassessed to determine if screening should resume. Some women have medical problems that increase the chance of getting cervical cancer. In these cases, your health care provider may recommend more frequent screening and Pap tests.  Colorectal cancer can be detected and often prevented. Most routine colorectal cancer screening begins at the age of 72 years and continues through age 12 years. However, your health care provider may recommend screening at an earlier age if you have risk factors for colon  cancer. On a yearly basis, your health care provider may provide home test kits to check for hidden blood in the stool. Use of a small camera at the end of a tube, to directly examine the colon (sigmoidoscopy or colonoscopy), can detect the earliest forms of colorectal cancer. Talk to your health care provider about this at age 24, when routine screening begins. Direct exam of the colon should be repeated every 5-10 years through age 46 years, unless early forms of precancerous polyps or small growths are found.  People who are at an increased risk for hepatitis B should be screened for this virus. You are considered at high risk for hepatitis B if:  You were born in a country where hepatitis B occurs often. Talk with your health care provider about which countries are considered high risk.  Your parents were born in a high-risk country and you have not received a shot to protect against hepatitis B (hepatitis B vaccine).  You have HIV or AIDS.  You use needles to inject street drugs.  You live with, or have sex with, someone who has hepatitis B.  You get hemodialysis treatment.  You take certain medicines for conditions like cancer, organ transplantation, and autoimmune conditions.  Hepatitis C blood testing is recommended for all people born from [birth year] through 1965 and any individual with known risks for hepatitis C.  Practice safe sex. Use condoms and avoid high-risk sexual practices to reduce the spread of sexually transmitted infections (STIs). STIs include gonorrhea, chlamydia, syphilis, trichomonas, herpes, HPV, and human immunodeficiency virus (HIV). Herpes, HIV, and HPV are viral illnesses that have no cure. They can result in disability, cancer, and death.  You should be screened for sexually transmitted illnesses (STIs) including gonorrhea and chlamydia if:  You are sexually active and are younger than 24 years.  You are older than 24 years and your health care provider tells you  that you are at risk for this type of infection.  Your sexual activity has changed since you were last screened and you are at an increased risk for chlamydia or gonorrhea. Ask your health care provider if you are at risk.  If you are at risk of being infected with HIV, it is recommended that you take a prescription medicine daily to prevent HIV infection. This is called preexposure prophylaxis (PrEP). You are considered at risk if:  You are sexually active and do not regularly use condoms or know the HIV status of your partner(s).  You take drugs by injection.  You are sexually active with a partner who has HIV.  Talk with your health care provider about whether you  are at high risk of being infected with HIV. If you choose to begin PrEP, you should first be tested for HIV. You should then be tested every 3 months for as long as you are taking PrEP.  Osteoporosis is a disease in which the bones lose minerals and strength with aging. This can result in serious bone fractures or breaks. The risk of osteoporosis can be identified using a bone density scan. Women ages 78 years and over and women at risk for fractures or osteoporosis should discuss screening with their health care providers. Ask your health care provider whether you should take a calcium supplement or vitamin D to reduce the rate of osteoporosis.  Menopause can be associated with physical symptoms and risks. Hormone replacement therapy is available to decrease symptoms and risks. You should talk to your health care provider about whether hormone replacement therapy is right for you.  Use sunscreen. Apply sunscreen liberally and repeatedly throughout the day. You should seek shade when your shadow is shorter than you. Protect yourself by wearing long sleeves, pants, a wide-brimmed hat, and sunglasses year round, whenever you are outdoors.  Once a month, do a whole body skin exam, using a mirror to look at the skin on your back. Tell  your health care provider of new moles, moles that have irregular borders, moles that are larger than a pencil eraser, or moles that have changed in shape or color.  Stay current with required vaccines (immunizations).  Influenza vaccine. All adults should be immunized every year.  Tetanus, diphtheria, and acellular pertussis (Td, Tdap) vaccine. Pregnant women should receive 1 dose of Tdap vaccine during each pregnancy. The dose should be obtained regardless of the length of time since the last dose. Immunization is preferred during the 27th-36th week of gestation. An adult who has not previously received Tdap or who does not know her vaccine status should receive 1 dose of Tdap. This initial dose should be followed by tetanus and diphtheria toxoids (Td) booster doses every 10 years. Adults with an unknown or incomplete history of completing a 3-dose immunization series with Td-containing vaccines should begin or complete a primary immunization series including a Tdap dose. Adults should receive a Td booster every 10 years.  Varicella vaccine. An adult without evidence of immunity to varicella should receive 2 doses or a second dose if she has previously received 1 dose. Pregnant females who do not have evidence of immunity should receive the first dose after pregnancy. This first dose should be obtained before leaving the health care facility. The second dose should be obtained 4-8 weeks after the first dose.  Human papillomavirus (HPV) vaccine. Females aged 13-26 years who have not received the vaccine previously should obtain the 3-dose series. The vaccine is not recommended for use in pregnant females. However, pregnancy testing is not needed before receiving a dose. If a female is found to be pregnant after receiving a dose, no treatment is needed. In that case, the remaining doses should be delayed until after the pregnancy. Immunization is recommended for any person with an immunocompromised  condition through the age of 62 years if she did not get any or all doses earlier. During the 3-dose series, the second dose should be obtained 4-8 weeks after the first dose. The third dose should be obtained 24 weeks after the first dose and 16 weeks after the second dose.  Zoster vaccine. One dose is recommended for adults aged 63 years or older unless certain conditions are  present.  Measles, mumps, and rubella (MMR) vaccine. Adults born before [birth year] generally are considered immune to measles and mumps. Adults born in [birth year] or later should have 1 or more doses of MMR vaccine unless there is a contraindication to the vaccine or there is laboratory evidence of immunity to each of the three diseases. A routine second dose of MMR vaccine should be obtained at least 28 days after the first dose for students attending postsecondary schools, health care workers, or international travelers. People who received inactivated measles vaccine or an unknown type of measles vaccine during 1963-1967 should receive 2 doses of MMR vaccine. People who received inactivated mumps vaccine or an unknown type of mumps vaccine before 1979 and are at high risk for mumps infection should consider immunization with 2 doses of MMR vaccine. For females of childbearing age, rubella immunity should be determined. If there is no evidence of immunity, females who are not pregnant should be vaccinated. If there is no evidence of immunity, females who are pregnant should delay immunization until after pregnancy. Unvaccinated health care workers born before [birth year] who lack laboratory evidence of measles, mumps, or rubella immunity or laboratory confirmation of disease should consider measles and mumps immunization with 2 doses of MMR vaccine or rubella immunization with 1 dose of MMR vaccine.  Pneumococcal 13-valent conjugate (PCV13) vaccine. When indicated, a person who is uncertain of his immunization history and has no record of immunization  should receive the PCV13 vaccine. All adults 62 years of age and older should receive this vaccine. An adult aged 72 years or older who has certain medical conditions and has not been previously immunized should receive 1 dose of PCV13 vaccine. This PCV13 should be followed with a dose of pneumococcal polysaccharide (PPSV23) vaccine. Adults who are at high risk for pneumococcal disease should obtain the PPSV23 vaccine at least 8 weeks after the dose of PCV13 vaccine. Adults older than 55 years of age who have normal immune system function should obtain the PPSV23 vaccine dose at least 1 year after the dose of PCV13 vaccine.  Pneumococcal polysaccharide (PPSV23) vaccine. When PCV13 is also indicated, PCV13 should be obtained first. All adults aged 70 years and older should be immunized. An adult younger than age 36 years who has certain medical conditions should be immunized. Any person who resides in a nursing home or long-term care facility should be immunized. An adult smoker should be immunized. People with an immunocompromised condition and certain other conditions should receive both PCV13 and PPSV23 vaccines. People with human immunodeficiency virus (HIV) infection should be immunized as soon as possible after diagnosis. Immunization during chemotherapy or radiation therapy should be avoided. Routine use of PPSV23 vaccine is not recommended for American Indians, [place] Natives, or people younger than 65 years unless there are medical conditions that require PPSV23 vaccine. When indicated, people who have unknown immunization and have no record of immunization should receive PPSV23 vaccine. One-time revaccination 5 years after the first dose of PPSV23 is recommended for people aged 19-64 years who have chronic kidney failure, nephrotic syndrome, asplenia, or immunocompromised conditions. People who received 1-2 doses of PPSV23 before age 76 years should receive another dose of PPSV23 vaccine at age 14 years  or later if at least 5 years have passed since the previous dose. Doses of PPSV23 are not needed for people immunized with PPSV23 at or after age 5 years.  Meningococcal vaccine. Adults with asplenia or persistent complement component deficiencies should receive 2 doses of  quadrivalent meningococcal conjugate (MenACWY-D) vaccine. The doses should be obtained at least 2 months apart. Microbiologists working with certain meningococcal bacteria, [employer] recruits, people at risk during an outbreak, and people who travel to or live in countries with a high rate of meningitis should be immunized. A first-year college student up through age 52 years who is living in a residence hall should receive a dose if she did not receive a dose on or after her 16th birthday. Adults who have certain high-risk conditions should receive one or more doses of vaccine.  Hepatitis A vaccine. Adults who wish to be protected from this disease, have certain high-risk conditions, work with hepatitis A-infected animals, work in hepatitis A research labs, or travel to or work in countries with a high rate of hepatitis A should be immunized. Adults who were previously unvaccinated and who anticipate close contact with an international adoptee during the first 60 days after arrival in the [place] from a country with a high rate of hepatitis A should be immunized.  Hepatitis B vaccine. Adults who wish to be protected from this disease, have certain high-risk conditions, may be exposed to blood or other infectious body fluids, are household contacts or sex partners of hepatitis B positive people, are clients or workers in certain care facilities, or travel to or work in countries with a high rate of hepatitis B should be immunized.  Haemophilus influenzae type b (Hib) vaccine. A previously unvaccinated person with asplenia or sickle cell disease or having a scheduled splenectomy should receive 1 dose of Hib vaccine. Regardless of  previous immunization, a recipient of a hematopoietic stem cell transplant should receive a 3-dose series 6-12 months after her successful transplant. Hib vaccine is not recommended for adults with HIV infection. Preventive Services / Frequency Ages 68 to 18 years  Blood pressure check.** / Every 3-5 years.  Lipid and cholesterol check.** / Every 5 years beginning at age 21.  Clinical breast exam.** / Every 3 years for women in their 65s and 42s.  BRCA-related cancer risk assessment.** / For women who have family members with a BRCA-related cancer (breast, ovarian, tubal, or peritoneal cancers).  Pap test.** / Every 2 years from ages 34 through 35. Every 3 years starting at age 54 through age 4 or 51 with a history of 3 consecutive normal Pap tests.  HPV screening.** / Every 3 years from ages 47 through ages 63 to 71 with a history of 3 consecutive normal Pap tests.  Hepatitis C blood test.** / For any individual with known risks for hepatitis C.  Skin self-exam. / Monthly.  Influenza vaccine. / Every year.  Tetanus, diphtheria, and acellular pertussis (Tdap, Td) vaccine.** / Consult your health care provider. Pregnant women should receive 1 dose of Tdap vaccine during each pregnancy. 1 dose of Td every 10 years.  Varicella vaccine.** / Consult your health care provider. Pregnant females who do not have evidence of immunity should receive the first dose after pregnancy.  HPV vaccine. / 3 doses over 6 months, if 82 and younger. The vaccine is not recommended for use in pregnant females. However, pregnancy testing is not needed before receiving a dose.  Measles, mumps, rubella (MMR) vaccine.** / You need at least 1 dose of MMR if you were born in 1957 or later. You may also need a 2nd dose. For females of childbearing age, rubella immunity should be determined. If there is no evidence of immunity, females who are not pregnant should be vaccinated.  If there is no evidence of immunity,  females who are pregnant should delay immunization until after pregnancy.  Pneumococcal 13-valent conjugate (PCV13) vaccine.** / Consult your health care provider.  Pneumococcal polysaccharide (PPSV23) vaccine.** / 1 to 2 doses if you smoke cigarettes or if you have certain conditions.  Meningococcal vaccine.** / 1 dose if you are age 23 to 62 years and a Market researcher living in a residence hall, or have one of several medical conditions, you need to get vaccinated against meningococcal disease. You may also need additional booster doses.  Hepatitis A vaccine.** / Consult your health care provider.  Hepatitis B vaccine.** / Consult your health care provider.  Haemophilus influenzae type b (Hib) vaccine.** / Consult your health care provider. Ages 47 to 37 years  Blood pressure check.** / Every year.  Lipid and cholesterol check.** / Every 5 years beginning at age 85 years.  Lung cancer screening. / Every year if you are aged 59-80 years and have a 30-pack-year history of smoking and currently smoke or have quit within the past 15 years. Yearly screening is stopped once you have quit smoking for at least 15 years or develop a health problem that would prevent you from having lung cancer treatment.  Clinical breast exam.** / Every year after age 46 years.  BRCA-related cancer risk assessment.** / For women who have family members with a BRCA-related cancer (breast, ovarian, tubal, or peritoneal cancers).  Mammogram.** / Every year beginning at age 62 years and continuing for as long as you are in good health. Consult with your health care provider.  Pap test.** / Every 3 years starting at age 69 years through age 80 or 58 years with a history of 3 consecutive normal Pap tests.  HPV screening.** / Every 3 years from ages 19 years through ages 33 to 2 years with a history of 3 consecutive normal Pap tests.  Fecal occult blood test (FOBT) of stool. / Every year beginning at  age 81 years and continuing until age 79 years. You may not need to do this test if you get a colonoscopy every 10 years.  Flexible sigmoidoscopy or colonoscopy.** / Every 5 years for a flexible sigmoidoscopy or every 10 years for a colonoscopy beginning at age 93 years and continuing until age 67 years.  Hepatitis C blood test.** / For all people born from [birth year] through 1965 and any individual with known risks for hepatitis C.  Skin self-exam. / Monthly.  Influenza vaccine. / Every year.  Tetanus, diphtheria, and acellular pertussis (Tdap/Td) vaccine.** / Consult your health care provider. Pregnant women should receive 1 dose of Tdap vaccine during each pregnancy. 1 dose of Td every 10 years.  Varicella vaccine.** / Consult your health care provider. Pregnant females who do not have evidence of immunity should receive the first dose after pregnancy.  Zoster vaccine.** / 1 dose for adults aged 68 years or older.  Measles, mumps, rubella (MMR) vaccine.** / You need at least 1 dose of MMR if you were born in 1957 or later. You may also need a second dose. For females of childbearing age, rubella immunity should be determined. If there is no evidence of immunity, females who are not pregnant should be vaccinated. If there is no evidence of immunity, females who are pregnant should delay immunization until after pregnancy.  Pneumococcal 13-valent conjugate (PCV13) vaccine.** / Consult your health care provider.  Pneumococcal polysaccharide (PPSV23) vaccine.** / 1 to 2 doses if you smoke cigarettes  or if you have certain conditions.  Meningococcal vaccine.** / Consult your health care provider.  Hepatitis A vaccine.** / Consult your health care provider.  Hepatitis B vaccine.** / Consult your health care provider.  Haemophilus influenzae type b (Hib) vaccine.** / Consult your health care provider. Ages 91 years and over  Blood pressure check.** / Every year.  Lipid and cholesterol check.**  / Every 5 years beginning at age 52 years.  Lung cancer screening. / Every year if you are aged 71-80 years and have a 30-pack-year history of smoking and currently smoke or have quit within the past 15 years. Yearly screening is stopped once you have quit smoking for at least 15 years or develop a health problem that would prevent you from having lung cancer treatment.  Clinical breast exam.** / Every year after age 45 years.  BRCA-related cancer risk assessment.** / For women who have family members with a BRCA-related cancer (breast, ovarian, tubal, or peritoneal cancers).  Mammogram.** / Every year beginning at age 36 years and continuing for as long as you are in good health. Consult with your health care provider.  Pap test.** / Every 3 years starting at age 68 years through age 70 or 60 years with 3 consecutive normal Pap tests. Testing can be stopped between 65 and 70 years with 3 consecutive normal Pap tests and no abnormal Pap or HPV tests in the past 10 years.  HPV screening.** / Every 3 years from ages 84 years through ages 74 or 15 years with a history of 3 consecutive normal Pap tests. Testing can be stopped between 65 and 70 years with 3 consecutive normal Pap tests and no abnormal Pap or HPV tests in the past 10 years.  Fecal occult blood test (FOBT) of stool. / Every year beginning at age 41 years and continuing until age 36 years. You may not need to do this test if you get a colonoscopy every 10 years.  Flexible sigmoidoscopy or colonoscopy.** / Every 5 years for a flexible sigmoidoscopy or every 10 years for a colonoscopy beginning at age 82 years and continuing until age 33 years.  Hepatitis C blood test.** / For all people born from [birth year] through 1965 and any individual with known risks for hepatitis C.  Osteoporosis screening.** / A one-time screening for women ages 54 years and over and women at risk for fractures or osteoporosis.  Skin self-exam. / Monthly.  Influenza  vaccine. / Every year.  Tetanus, diphtheria, and acellular pertussis (Tdap/Td) vaccine.** / 1 dose of Td every 10 years.  Varicella vaccine.** / Consult your health care provider.  Zoster vaccine.** / 1 dose for adults aged 32 years or older.  Pneumococcal 13-valent conjugate (PCV13) vaccine.** / Consult your health care provider.  Pneumococcal polysaccharide (PPSV23) vaccine.** / 1 dose for all adults aged 45 years and older.  Meningococcal vaccine.** / Consult your health care provider.  Hepatitis A vaccine.** / Consult your health care provider.  Hepatitis B vaccine.** / Consult your health care provider.  Haemophilus influenzae type b (Hib) vaccine.** / Consult your health care provider. ** Family history and personal history of risk and conditions may change your health care provider's recommendations.   This information is not intended to replace advice given to you by your health care provider. Make sure you discuss any questions you have with your health care provider.   Document Released: 09/07/2001 Document Revised: 08/02/2014 Document Reviewed: 12/07/2010 Elsevier Interactive Patient Education [employer].

## 2016-05-14 ENCOUNTER — Ambulatory Visit (INDEPENDENT_AMBULATORY_CARE_PROVIDER_SITE_OTHER): Payer: Managed Care, Other (non HMO) | Admitting: Behavioral Health

## 2016-05-14 DIAGNOSIS — Z23 Encounter for immunization: Secondary | ICD-10-CM

## 2016-05-14 NOTE — Progress Notes (Signed)
Pre visit review using our clinic review tool, if applicable. No additional management support is needed unless otherwise documented below in the visit note.  Pt tolerated injection well.    

## 2016-05-19 ENCOUNTER — Ambulatory Visit: Payer: Managed Care, Other (non HMO) | Admitting: Family Medicine

## 2016-09-20 ENCOUNTER — Encounter: Payer: Self-pay | Admitting: Family Medicine

## 2016-10-04 ENCOUNTER — Ambulatory Visit (INDEPENDENT_AMBULATORY_CARE_PROVIDER_SITE_OTHER): Payer: Managed Care, Other (non HMO) | Admitting: Family Medicine

## 2016-10-04 ENCOUNTER — Ambulatory Visit (HOSPITAL_BASED_OUTPATIENT_CLINIC_OR_DEPARTMENT_OTHER)
Admission: RE | Admit: 2016-10-04 | Discharge: 2016-10-04 | Disposition: A | Discharge status: Home / Self Care | Payer: Managed Care, Other (non HMO) | Source: Ambulatory Visit | Attending: Family Medicine | Admitting: Family Medicine

## 2016-10-04 ENCOUNTER — Encounter: Payer: Self-pay | Admitting: Family Medicine

## 2016-10-04 VITALS — BP 86/62 | HR 89 | Temp 98.1°F | Resp 18 | Wt 115.4 lb

## 2016-10-04 DIAGNOSIS — M25561 Pain in right knee: Secondary | ICD-10-CM | POA: Diagnosis present

## 2016-10-04 DIAGNOSIS — M1712 Unilateral primary osteoarthritis, left knee: Secondary | ICD-10-CM | POA: Diagnosis not present

## 2016-10-04 DIAGNOSIS — M7989 Other specified soft tissue disorders: Secondary | ICD-10-CM | POA: Insufficient documentation

## 2016-10-04 DIAGNOSIS — S8991XA Unspecified injury of right lower leg, initial encounter: Secondary | ICD-10-CM | POA: Insufficient documentation

## 2016-10-04 DIAGNOSIS — X58XXXA Exposure to other specified factors, initial encounter: Secondary | ICD-10-CM | POA: Diagnosis not present

## 2016-10-04 HISTORY — DX: Unspecified injury of right lower leg, initial encounter: S89.91XA

## 2016-10-04 NOTE — Assessment & Plan Note (Signed)
Pain in knee improved with yoga daily. Encouraged to continue the same.

## 2016-10-04 NOTE — Progress Notes (Signed)
Pre visit review using our clinic review tool, if applicable. No additional management support is needed unless otherwise documented below in the visit note. 

## 2016-10-04 NOTE — Patient Instructions (Signed)
Knee Pain, Adult Knee pain in adults is common. It can be caused by many things, including:  Arthritis.  A fluid-filled sac (cyst) or growth in your knee.  An infection in your knee.  An injury that will not heal.  Damage, swelling, or irritation of the tissues that support your knee. Knee pain is usually not a sign of a serious problem. The pain may go away on its own with time and rest. If it does not, a health care provider may order tests to find the cause of the pain. These may include:  Imaging tests, such as an X-ray, MRI, or ultrasound.  Joint aspiration. In this test, fluid is removed from the knee.  Arthroscopy. In this test, a lighted tube is inserted into knee and an image is projected onto a TV screen.  A biopsy. In this test, a sample of tissue is removed from the body and studied under a microscope. Follow these instructions at home: Pay attention to any changes in your symptoms. Take these actions to relieve your pain. Activity   Rest your knee.  Do not do things that cause pain or make pain worse.  Avoid high-impact activities or exercises, such as running, jumping rope, or doing jumping jacks. General instructions   Take over-the-counter and prescription medicines only as told by your health care provider.  Raise (elevate) your knee above the level of your heart when you are sitting or lying down.  Sleep with a pillow under your knee.  If directed, apply ice to the knee:  Put ice in a plastic bag.  Place a towel between your skin and the bag.  Leave the ice on for 20 minutes, 2-3 times a day.  Ask your health care provider if you should wear an elastic knee support.  Lose weight if you are overweight. Extra weight can put pressure on your knee.  Do not use any products that contain nicotine or tobacco, such as cigarettes and e-cigarettes. Smoking may slow the healing of any bone and joint problems that you may have. If you need help quitting, ask  your health care provider. Contact a health care provider if:  Your knee pain continues, changes, or gets worse.  You have a fever along with knee pain.  Your knee buckles or locks up.  Your knee swells, and the swelling becomes worse. Get help right away if:  Your knee feels warm to the touch.  You cannot move your knee.  You have severe pain in your knee.  You have chest pain.  You have trouble breathing. Summary  Knee pain in adults is common. It can be caused by many things, including, arthritis, infection, cysts, or injury.  Knee pain is usually not a sign of a serious problem, but if it does not go away, a health care provider may perform tests to know the cause of the pain.  Pay attention to any changes in your symptoms. Relieve your pain with rest, medicines, light activity, and use of ice.  Get help if your pain continues or becomes very severe, or if your knee buckles or locks up, or if you have chest pain or trouble breathing. This information is not intended to replace advice given to you by your health care provider. Make sure you discuss any questions you have with your health care provider. Document Released: 05/09/2007 Document Revised: 07/02/2016 Document Reviewed: 07/02/2016 Elsevier Interactive Patient Education  2017 [employer].

## 2016-10-04 NOTE — Progress Notes (Signed)
Subjective:  I acted as a Education administrator for Dr. Charlett Blake. [place], [place]   Patient ID: Veronica Aguilar, female    DOB: [birth year], 56 y.o.   MRN: [id number]  Chief Complaint  Patient presents with  . Knee Pain    Knee Pain   The incident occurred more than 1 week ago. The incident occurred at home. The injury mechanism was a twisting injury. The quality of the pain is described as aching. The pain is at a severity of 8/10. The pain is moderate. The pain has been intermittent since onset. Associated symptoms include an inability to bear weight. Pertinent negatives include no loss of sensation, muscle weakness, numbness or tingling. The symptoms are aggravated by movement. She has tried elevation, ice, heat, acetaminophen and NSAIDs for the symptoms. The treatment provided moderate relief.    Patient is in today for Right knee pain, she stated she twisted her knee while lifting her dog out of her car. She felt the top of her knee went one way and the bottom of her knee went the other way. immediately she could not bare weight and she has tried to keep stress off of it since by using a crutch. She has been elevating it and icing with some relief. Her left knee has not flared as a result and in general it is better with yoga daily. Denies CP/palp/SOB/HA/congestion/fevers/GI or GU c/o. Taking meds as prescribed  Patient Care Team: Mosie Lukes, MD as PCP - General (Family Medicine) Regina Eck, CNM as Referring Physician (Certified Nurse Midwife)   Past Medical History:  Diagnosis Date  . Arthritis of left knee 04/12/2016  . Chicken pox as a child  . Hyperlipidemia 08/19/2011  . Measles as a child  . Menopause   . Menstrual migraine    with aura  . Preventative health care 08/26/2011  . Right knee injury 10/04/2016  . Scoliosis 08/26/2011  . UTI (lower urinary tract infection) 04/18/2012    Past Surgical History:  Procedure Laterality Date  . ARTHROSCOPIC REPAIR ACL  2011   left     Family History  Problem Relation Age of Onset  . Cancer Mother 10    ovarian- remission  . Hyperlipidemia Mother   . Heart disease Father     valve replaced  . Glaucoma Father   . Heart disease Brother     stent  . Cancer Brother     melanoma  . Hyperlipidemia Brother   . Macular degeneration Maternal Grandmother   . Appendicitis Maternal Grandfather   . Cancer Paternal Grandfather     bone  . Depression Daughter   . Hyperlipidemia Brother   . Hyperlipidemia Brother   . Hyperlipidemia Sister   . Colon cancer Neg Hx     Social History   Social History  . Marital status: Married    Spouse name: N/A  . Number of children: N/A  . Years of education: N/A   Occupational History  . Not on file.   Social History Main Topics  . Smoking status: Never Smoker  . Smokeless tobacco: Never Used  . Alcohol use 4.2 - 8.4 oz/week    7 - 14 Glasses of wine per week  . Drug use: No  . Sexual activity: Yes    Partners: Male    Birth control/ protection: Pill     Comment: Micronor   Other Topics Concern  . Not on file   Social History Narrative  . No narrative on file  Outpatient Medications Prior to Visit  Medication Sig Dispense Refill  . Calcium-Magnesium-Vitamin D 185-50-100 MG-MG-UNIT CAPS Take by mouth.    Marland Kitchen KRILL OIL PO Take by mouth.    Marland Kitchen VITAMIN D, CHOLECALCIFEROL, PO Take 2 tablets by mouth daily.    Marland Kitchen LORazepam (ATIVAN) 0.5 MG tablet Take 1 tablet (0.5 mg total) by mouth 2 (two) times daily. (Patient not taking: Reported on 10/04/2016) 30 tablet 0   No facility-administered medications prior to visit.     No Known Allergies  Review of Systems  Constitutional: Negative for fever and malaise/fatigue.  HENT: Negative for congestion.   Eyes: Negative for blurred vision.  Respiratory: Negative for cough and shortness of breath.   Cardiovascular: Negative for chest pain, palpitations and leg swelling.  Gastrointestinal: Negative for vomiting.   Musculoskeletal: Positive for joint pain. Negative for back pain.  Skin: Negative for rash.  Neurological: Negative for tingling, loss of consciousness, numbness and headaches.       Objective:    Physical Exam  Constitutional: She is oriented to person, place, and time. She appears well-developed and well-nourished. No distress.  HENT:  Head: Normocephalic and atraumatic.  Eyes: Conjunctivae are normal.  Neck: Normal range of motion. No thyromegaly present.  Cardiovascular: Normal rate and regular rhythm.   Pulmonary/Chest: Effort normal and breath sounds normal. She has no wheezes.  Abdominal: Soft. Bowel sounds are normal. There is no tenderness.  Musculoskeletal: Normal range of motion. She exhibits tenderness. She exhibits no edema or deformity.  Mildly tender, medial aspect of leg just below the knee. Slight swelling b/l to patella. nontender and no warmth or redness right knee  Neurological: She is alert and oriented to person, place, and time.  Skin: Skin is warm and dry. She is not diaphoretic.  Psychiatric: She has a normal mood and affect.    BP (!) 86/62 (BP Location: Left Arm, Patient Position: Sitting, Cuff Size: Normal)   Pulse 89   Temp 98.1 F (36.7 C) (Oral)   Resp 18   Wt 115 lb 6.4 oz (52.3 kg)   SpO2 98%   BMI 20.77 kg/m  Wt Readings from Last 3 Encounters:  10/04/16 115 lb 6.4 oz (52.3 kg)  04/26/16 114 lb 12.8 oz (52.1 kg)  04/07/16 112 lb (50.8 kg)     Lab Results  Component Value Date   WBC 4.2 04/26/2016   HGB 13.9 04/26/2016   HCT 40.9 04/26/2016   PLT 221.0 04/26/2016   GLUCOSE 82 04/26/2016   CHOL 260 (H) 04/26/2016   TRIG 106.0 04/26/2016   HDL 101.50 04/26/2016   LDLDIRECT 141.6 08/24/2011   LDLCALC 137 (H) 04/26/2016   ALT 16 04/26/2016   AST 19 04/26/2016   NA 140 04/26/2016   K 3.9 04/26/2016   CL 103 04/26/2016   CREATININE 0.68 04/26/2016   BUN 10 04/26/2016   CO2 30 04/26/2016   TSH 1.39 04/26/2016    Lab Results   Component Value Date   TSH 1.39 04/26/2016   Lab Results  Component Value Date   WBC 4.2 04/26/2016   HGB 13.9 04/26/2016   HCT 40.9 04/26/2016   MCV 88.8 04/26/2016   PLT 221.0 04/26/2016   Lab Results  Component Value Date   NA 140 04/26/2016   K 3.9 04/26/2016   CO2 30 04/26/2016   GLUCOSE 82 04/26/2016   BUN 10 04/26/2016   CREATININE 0.68 04/26/2016   BILITOT 0.6 04/26/2016   ALKPHOS 60 04/26/2016  AST 19 04/26/2016   ALT 16 04/26/2016   PROT 6.8 04/26/2016   ALBUMIN 4.2 04/26/2016   CALCIUM 9.4 04/26/2016   GFR 95.27 04/26/2016   Lab Results  Component Value Date   CHOL 260 (H) 04/26/2016   Lab Results  Component Value Date   HDL 101.50 04/26/2016   Lab Results  Component Value Date   LDLCALC 137 (H) 04/26/2016   Lab Results  Component Value Date   TRIG 106.0 04/26/2016   Lab Results  Component Value Date   CHOLHDL 3 04/26/2016   No results found for: HGBA1C     Assessment & Plan:   Problem List Items Addressed This Visit    Arthritis of left knee    Pain in knee improved with yoga daily. Encouraged to continue the same.      Right knee injury - Primary    Twisted her knee 4 days ago. Pain is improving with her icing, elevation and bracing but she still has significant increase in pain with weight bearing. Will proceed with xray and referred to sports med for further consideration. Encouraged to add topical lidocaine and NSAIDs as needed.      Relevant Orders   DG Knee Complete 4 Views Right   Ambulatory referral to Sports Medicine      I am having Ms. Piacentini maintain her (VITAMIN D, CHOLECALCIFEROL, PO), Calcium-Magnesium-Vitamin D, KRILL OIL PO, and LORazepam.  No orders of the defined types were placed in this encounter.   CMA served as Education administrator during this visit. History, Physical and Plan performed by medical provider. Documentation and orders reviewed and attested to.  Penni Homans, MD

## 2016-10-04 NOTE — Assessment & Plan Note (Signed)
Twisted her knee 4 days ago. Pain is improving with her icing, elevation and bracing but she still has significant increase in pain with weight bearing. Will proceed with xray and referred to sports med for further consideration. Encouraged to add topical lidocaine and NSAIDs as needed.

## 2016-10-07 ENCOUNTER — Encounter: Payer: Self-pay | Admitting: Family Medicine

## 2016-10-07 ENCOUNTER — Ambulatory Visit (INDEPENDENT_AMBULATORY_CARE_PROVIDER_SITE_OTHER): Payer: Managed Care, Other (non HMO) | Admitting: Family Medicine

## 2016-10-07 DIAGNOSIS — S8991XD Unspecified injury of right lower leg, subsequent encounter: Secondary | ICD-10-CM | POA: Diagnosis not present

## 2016-10-07 NOTE — Patient Instructions (Signed)
Your exam, ultrasound, and x-rays are reassuring. This is consistent with a mild knee sprain and meniscus contusion. I would avoid running, any activities that involve twisting for another 2 weeks. Ok for swimming, cycling, elliptical, weight training, yoga though. Icing only if needed. Tylenol, ibuprofen only if needed. Straight leg raises, knee extensions, hamstring curls 3 sets of 10 once a day to maintain strength in lower leg muscles - this will make the knee feel more stable also. Call me if you have any concerns otherwise follow up as needed.

## 2016-10-11 NOTE — Progress Notes (Signed)
PCP and consultation requested by: Penni Homans, MD  Subjective:   HPI: Patient is a 56 y.o. female here for right knee injury.  Patient reports on 3/8 she accidentally twisted her right knee when getting her dog out of the car. Pain was fairly severe, slight swelling with this. Since then pain has improved quite a bit. Now only with an ache and 1/10 level of pain. Some worse when up and walking. No skin changes, numbness.  Past Medical History:  Diagnosis Date  . Arthritis of left knee 04/12/2016  . Chicken pox as a child  . Hyperlipidemia 08/19/2011  . Measles as a child  . Menopause   . Menstrual migraine    with aura  . Preventative health care 08/26/2011  . Right knee injury 10/04/2016  . Scoliosis 08/26/2011  . UTI (lower urinary tract infection) 04/18/2012    Current Outpatient Prescriptions on File Prior to Visit  Medication Sig Dispense Refill  . Calcium-Magnesium-Vitamin D 185-50-100 MG-MG-UNIT CAPS Take by mouth.    Marland Kitchen KRILL OIL PO Take by mouth.    Marland Kitchen LORazepam (ATIVAN) 0.5 MG tablet Take 1 tablet (0.5 mg total) by mouth 2 (two) times daily. (Patient not taking: Reported on 10/04/2016) 30 tablet 0  . VITAMIN D, CHOLECALCIFEROL, PO Take 2 tablets by mouth daily.     No current facility-administered medications on file prior to visit.     Past Surgical History:  Procedure Laterality Date  . ARTHROSCOPIC REPAIR ACL  2011   left    No Known Allergies  Social History   Social History  . Marital status: Married    Spouse name: N/A  . Number of children: N/A  . Years of education: N/A   Occupational History  . Not on file.   Social History Main Topics  . Smoking status: Never Smoker  . Smokeless tobacco: Never Used  . Alcohol use 4.2 - 8.4 oz/week    7 - 14 Glasses of wine per week  . Drug use: No  . Sexual activity: Yes    Partners: Male    Birth control/ protection: Pill     Comment: Micronor   Other Topics Concern  . Not on file   Social History  Narrative  . No narrative on file    Family History  Problem Relation Age of Onset  . Cancer Mother 20    ovarian- remission  . Hyperlipidemia Mother   . Heart disease Father     valve replaced  . Glaucoma Father   . Heart disease Brother     stent  . Cancer Brother     melanoma  . Hyperlipidemia Brother   . Macular degeneration Maternal Grandmother   . Appendicitis Maternal Grandfather   . Cancer Paternal Grandfather     bone  . Depression Daughter   . Hyperlipidemia Brother   . Hyperlipidemia Brother   . Hyperlipidemia Sister   . Colon cancer Neg Hx     BP (!) 89/58   Pulse (!) 153   Ht 5\' 2"  (1.575 m)   Wt 115 lb (52.2 kg)   BMI 21.03 kg/m   Review of Systems: See HPI above.     Objective:  Physical Exam:  Gen: NAD, comfortable in exam room  Right knee: No gross deformity, ecchymoses, effusion. No TTP. FROM. Negative ant/post drawers. Negative valgus/varus testing. Negative lachmanns. Negative mcmurrays, apleys, patellar apprehension. NV intact distally.  Left knee: FROM without pain.   Assessment & Plan:  1.  Right knee injury - occurred with twisting.  Exam and ultrasound are reassuring for ligament tear, meniscus tear and she has clinically improved over past week.  Consistent with mild sprain and likely meniscus contusion.  Icing, tylenol or ibuprofen if needed.  Shown home exercises to do daily.  Call us if not continuing to improve over next 3-4 weeks and would reevaluate.

## 2016-10-12 NOTE — Assessment & Plan Note (Signed)
occurred with twisting.  Exam and ultrasound are reassuring for ligament tear, meniscus tear and she has clinically improved over past week.  Consistent with mild sprain and likely meniscus contusion.  Icing, tylenol or ibuprofen if needed.  Shown home exercises to do daily.  Call us if not continuing to improve over next 3-4 weeks and would reevaluate.

## 2016-11-10 ENCOUNTER — Encounter: Payer: Self-pay | Admitting: Family Medicine

## 2017-04-28 ENCOUNTER — Encounter: Payer: Self-pay | Admitting: Family Medicine

## 2017-04-28 ENCOUNTER — Ambulatory Visit (INDEPENDENT_AMBULATORY_CARE_PROVIDER_SITE_OTHER): Payer: Managed Care, Other (non HMO) | Admitting: Family Medicine

## 2017-04-28 VITALS — BP 103/66 | HR 67 | Temp 98.4°F | Resp 18 | Ht 62.3 in | Wt 116.0 lb

## 2017-04-28 DIAGNOSIS — Z23 Encounter for immunization: Secondary | ICD-10-CM

## 2017-04-28 DIAGNOSIS — Z0000 Encounter for general adult medical examination without abnormal findings: Secondary | ICD-10-CM

## 2017-04-28 DIAGNOSIS — Z1231 Encounter for screening mammogram for malignant neoplasm of breast: Secondary | ICD-10-CM

## 2017-04-28 DIAGNOSIS — E782 Mixed hyperlipidemia: Secondary | ICD-10-CM

## 2017-04-28 DIAGNOSIS — Z1239 Encounter for other screening for malignant neoplasm of breast: Secondary | ICD-10-CM

## 2017-04-28 LAB — COMPREHENSIVE METABOLIC PANEL
ALK PHOS: 53 U/L (ref 39–117)
ALT: 18 U/L (ref 0–35)
AST: 19 U/L (ref 0–37)
Albumin: 4.5 g/dL (ref 3.5–5.2)
BILIRUBIN TOTAL: 0.5 mg/dL (ref 0.2–1.2)
BUN: 14 mg/dL (ref 6–23)
CALCIUM: 9.9 mg/dL (ref 8.4–10.5)
CO2: 30 meq/L (ref 19–32)
CREATININE: 0.71 mg/dL (ref 0.40–1.20)
Chloride: 102 mEq/L (ref 96–112)
GFR: 90.31 mL/min (ref >60.00)
GLUCOSE: 96 mg/dL (ref 70–99)
Potassium: 4.3 mEq/L (ref 3.5–5.1)
Sodium: 139 mEq/L (ref 135–145)
TOTAL PROTEIN: 7 g/dL (ref 6.0–8.3)

## 2017-04-28 LAB — LIPID PANEL
CHOL/HDL RATIO: 3
Cholesterol: 262 mg/dL — ABNORMAL HIGH (ref 0–200)
HDL: 102 mg/dL (ref >39.00)
LDL Cholesterol: 146 mg/dL — ABNORMAL HIGH (ref 0–99)
NonHDL: 160.08
TRIGLYCERIDES: 70 mg/dL (ref 0.0–149.0)
VLDL: 14 mg/dL (ref 0.0–40.0)

## 2017-04-28 LAB — CBC
HCT: 42.6 % (ref 36.0–46.0)
Hemoglobin: 13.9 g/dL (ref 12.0–15.0)
MCHC: 32.6 g/dL (ref 30.0–36.0)
MCV: 93.1 fl (ref 78.0–100.0)
Platelets: 205 10*3/uL (ref 150.0–400.0)
RBC: 4.58 Mil/uL (ref 3.87–5.11)
RDW: 14.1 % (ref 11.5–15.5)
WBC: 4.4 10*3/uL (ref 4.0–10.5)

## 2017-04-28 LAB — TSH: TSH: 0.88 u[IU]/mL (ref 0.35–4.50)

## 2017-04-28 NOTE — Assessment & Plan Note (Signed)
Patient encouraged to maintain heart healthy diet, regular exercise, adequate sleep. Consider daily probiotics. Take medications as prescribed 

## 2017-04-28 NOTE — Patient Instructions (Addendum)
Shingrix is the new shingles shot 2 shots over 2-6 months Call insurance to confirm payment of 3D mgm and shingirx   Preventive Care 40-64 Years, Female Preventive care refers to lifestyle choices and visits with your health care provider that can promote health and wellness. What does preventive care include?  A yearly physical exam. This is also called an annual well check.  Dental exams once or twice a year.  Routine eye exams. Ask your health care provider how often you should have your eyes checked.  Personal lifestyle choices, including: ? Daily care of your teeth and gums. ? Regular physical activity. ? Eating a healthy diet. ? Avoiding tobacco and drug use. ? Limiting alcohol use. ? Practicing safe sex. ? Taking low-dose aspirin daily starting at age 21. ? Taking vitamin and mineral supplements as recommended by your health care provider. What happens during an annual well check? The services and screenings done by your health care provider during your annual well check will depend on your age, overall health, lifestyle risk factors, and family history of disease. Counseling Your health care provider may ask you questions about your:  Alcohol use.  Tobacco use.  Drug use.  Emotional well-being.  Home and relationship well-being.  Sexual activity.  Eating habits.  Work and work Statistician.  Method of birth control.  Menstrual cycle.  Pregnancy history.  Screening You may have the following tests or measurements:  Height, weight, and BMI.  Blood pressure.  Lipid and cholesterol levels. These may be checked every 5 years, or more frequently if you are over 54 years old.  Skin check.  Lung cancer screening. You may have this screening every year starting at age 39 if you have a 30-pack-year history of smoking and currently smoke or have quit within the past 15 years.  Fecal occult blood test (FOBT) of the stool. You may have this test every year  starting at age 81.  Flexible sigmoidoscopy or colonoscopy. You may have a sigmoidoscopy every 5 years or a colonoscopy every 10 years starting at age 28.  Hepatitis C blood test.  Hepatitis B blood test.  Sexually transmitted disease (STD) testing.  Diabetes screening. This is done by checking your blood sugar (glucose) after you have not eaten for a while (fasting). You may have this done every 1-3 years.  Mammogram. This may be done every 1-2 years. Talk to your health care provider about when you should start having regular mammograms. This may depend on whether you have a family history of breast cancer.  BRCA-related cancer screening. This may be done if you have a family history of breast, ovarian, tubal, or peritoneal cancers.  Pelvic exam and Pap test. This may be done every 3 years starting at age 86. Starting at age 21, this may be done every 5 years if you have a Pap test in combination with an HPV test.  Bone density scan. This is done to screen for osteoporosis. You may have this scan if you are at high risk for osteoporosis.  Discuss your test results, treatment options, and if necessary, the need for more tests with your health care provider. Vaccines Your health care provider may recommend certain vaccines, such as:  Influenza vaccine. This is recommended every year.  Tetanus, diphtheria, and acellular pertussis (Tdap, Td) vaccine. You may need a Td booster every 10 years.  Varicella vaccine. You may need this if you have not been vaccinated.  Zoster vaccine. You may need this after  age 65.  Measles, mumps, and rubella (MMR) vaccine. You may need at least one dose of MMR if you were born in 1957 or later. You may also need a second dose.  Pneumococcal 13-valent conjugate (PCV13) vaccine. You may need this if you have certain conditions and were not previously vaccinated.  Pneumococcal polysaccharide (PPSV23) vaccine. You may need one or two doses if you smoke  cigarettes or if you have certain conditions.  Meningococcal vaccine. You may need this if you have certain conditions.  Hepatitis A vaccine. You may need this if you have certain conditions or if you travel or work in places where you may be exposed to hepatitis A.  Hepatitis B vaccine. You may need this if you have certain conditions or if you travel or work in places where you may be exposed to hepatitis B.  Haemophilus influenzae type b (Hib) vaccine. You may need this if you have certain conditions.  Talk to your health care provider about which screenings and vaccines you need and how often you need them. This information is not intended to replace advice given to you by your health care provider. Make sure you discuss any questions you have with your health care provider. Document Released: 08/08/2015 Document Revised: 03/31/2016 Document Reviewed: 05/13/2015 Elsevier Interactive Patient Education  2017 [employer].
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
## 2017-04-28 NOTE — Assessment & Plan Note (Signed)
Encouraged heart healthy diet, increase exercise, avoid trans fats, consider a krill oil cap daily 

## 2017-05-05 ENCOUNTER — Ambulatory Visit (HOSPITAL_BASED_OUTPATIENT_CLINIC_OR_DEPARTMENT_OTHER)
Admission: RE | Admit: 2017-05-05 | Discharge: 2017-05-05 | Disposition: A | Discharge status: Home / Self Care | Payer: Managed Care, Other (non HMO) | Source: Ambulatory Visit | Attending: Family Medicine | Admitting: Family Medicine

## 2017-05-05 DIAGNOSIS — Z1231 Encounter for screening mammogram for malignant neoplasm of breast: Secondary | ICD-10-CM | POA: Insufficient documentation

## 2017-05-05 DIAGNOSIS — Z1239 Encounter for other screening for malignant neoplasm of breast: Secondary | ICD-10-CM

## 2017-05-21 IMAGING — DX DG KNEE COMPLETE 4+V*R*
4 series · 4 of 4 positions shown · non-contrast
Comparison: None.

CLINICAL DATA: Right knee pain and swelling.  Twisting injury.

EXAM:
RIGHT KNEE - COMPLETE 4+ VIEW

[knee ap]
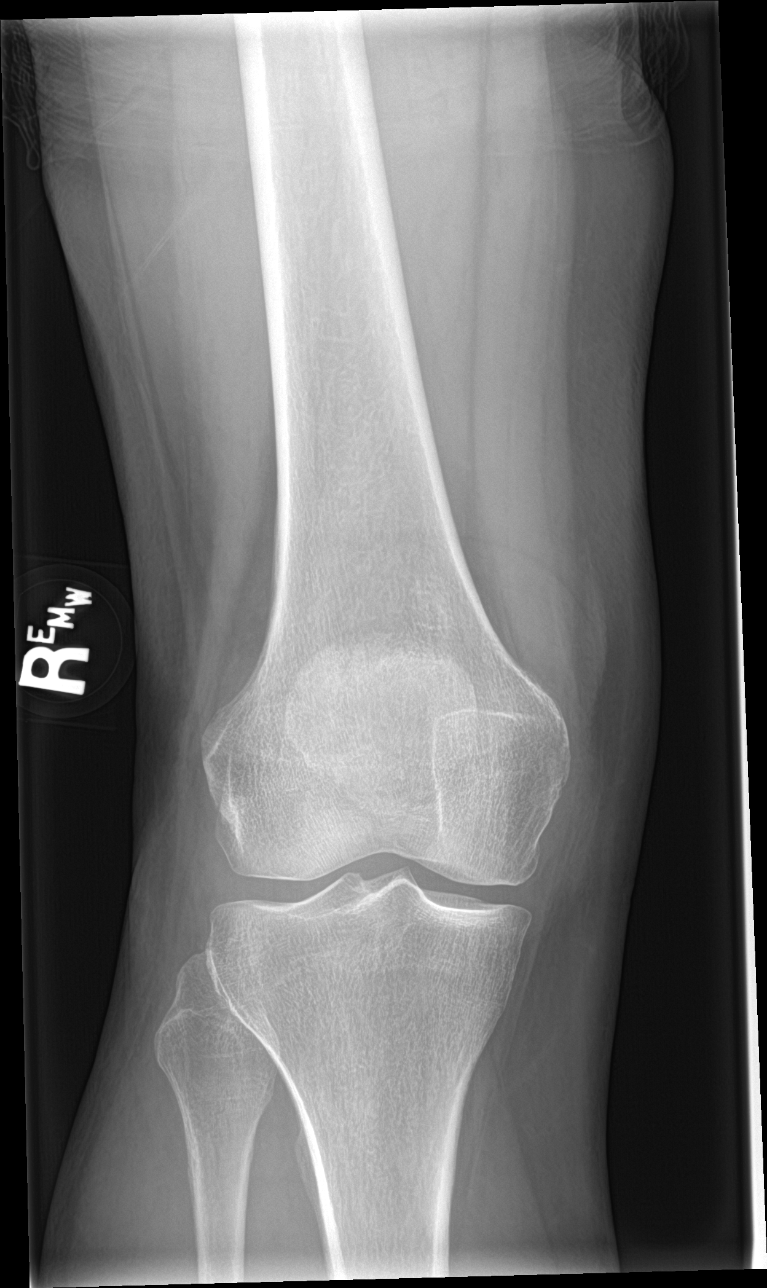

[knee lat]
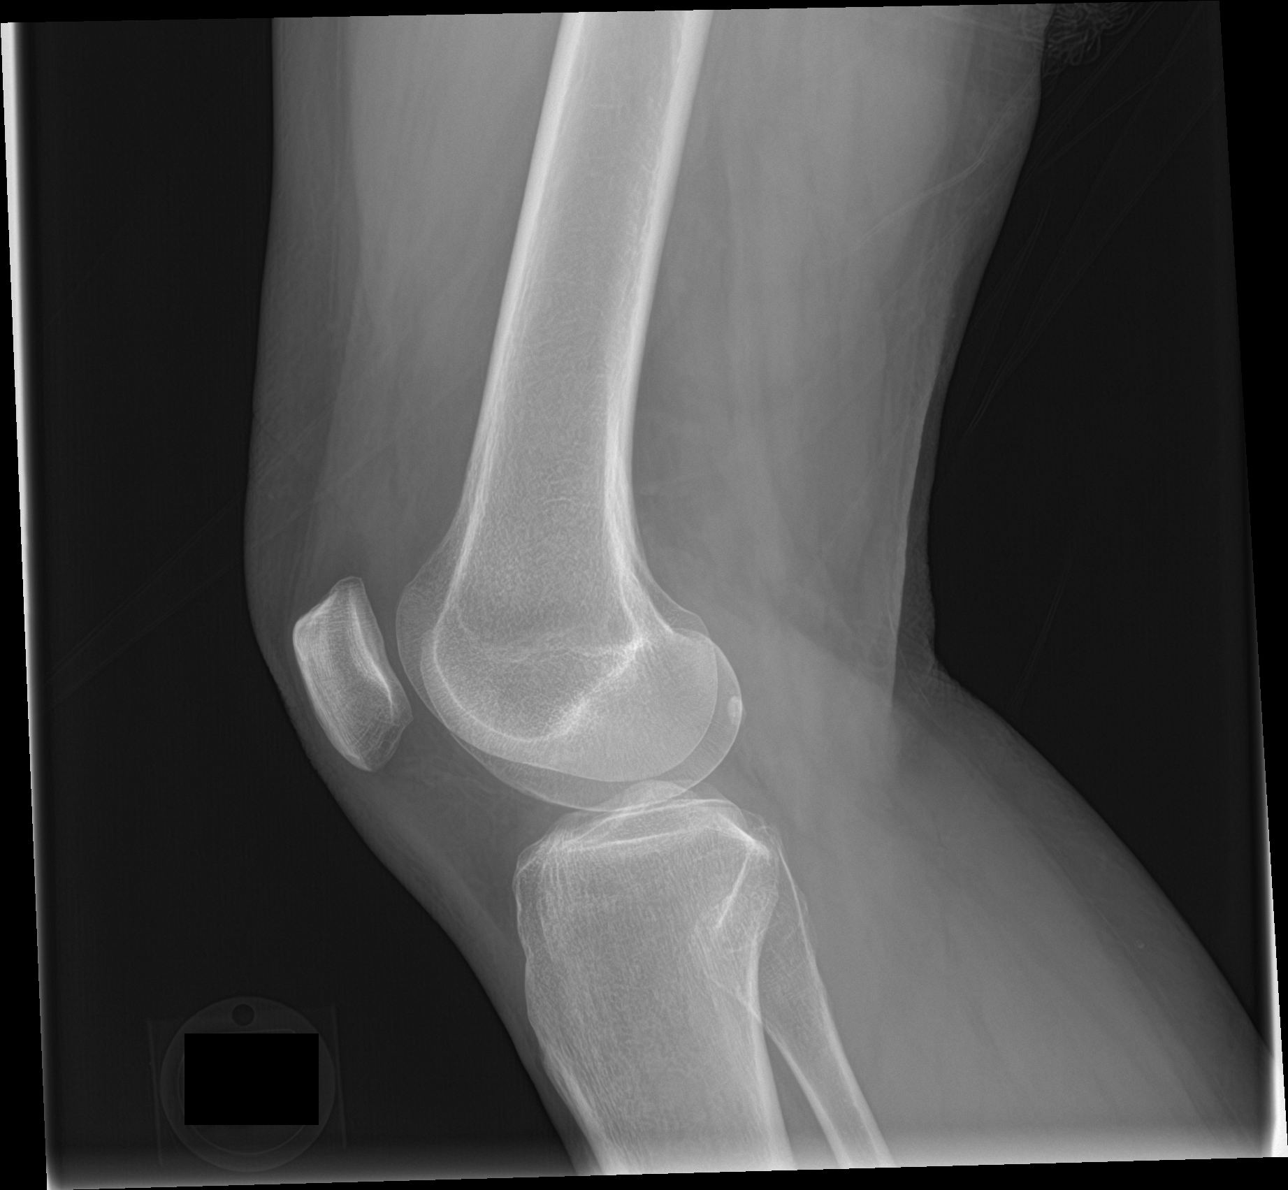

[knee obl (1 of 2)]
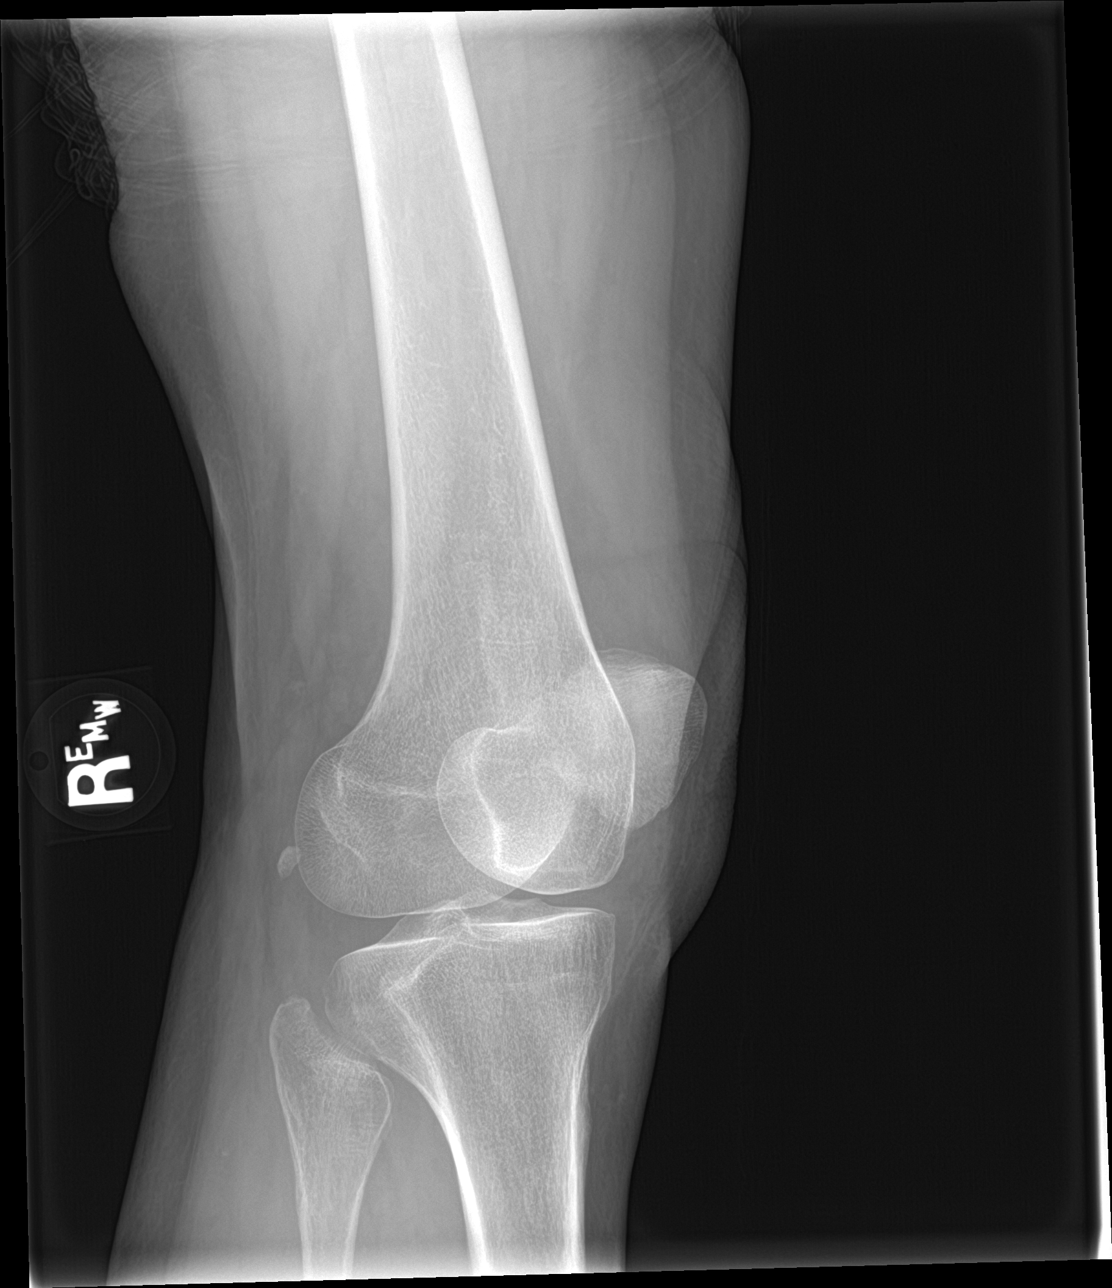

[knee obl (2 of 2)]
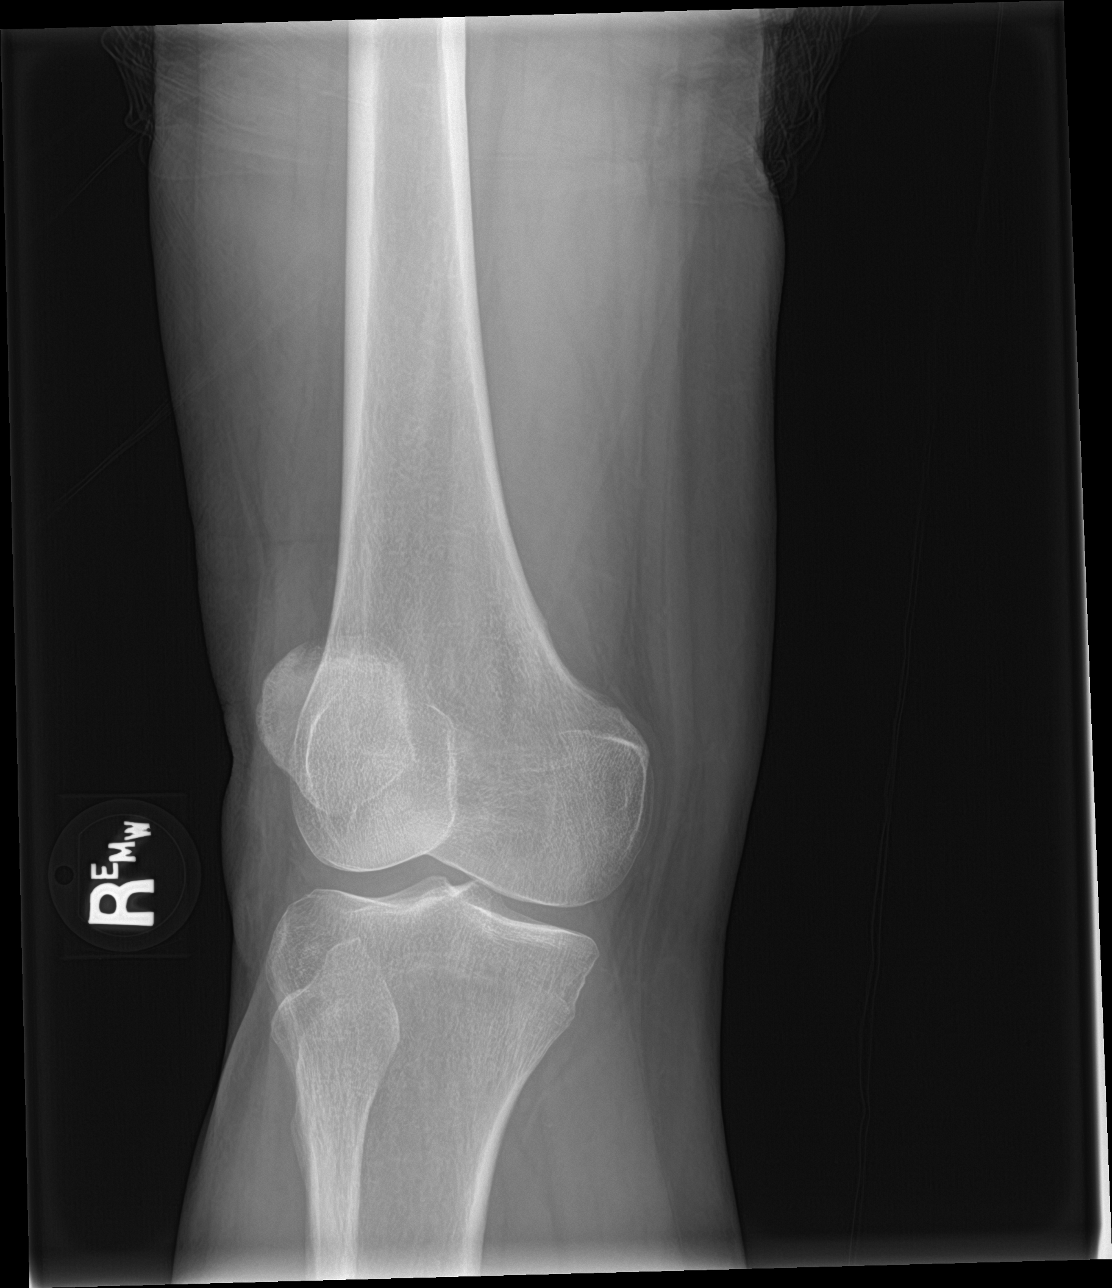

[4 of 4 positions shown; findings below may reference images not displayed]

FINDINGS: No acute bony abnormality. Specifically, no fracture, subluxation,
or dislocation. Soft tissues are intact. No significant joint
effusion.
IMPRESSION: No acute bony abnormality.

## 2017-12-21 ENCOUNTER — Ambulatory Visit: Payer: Managed Care, Other (non HMO) | Admitting: Obstetrics & Gynecology

## 2018-05-01 ENCOUNTER — Encounter: Payer: Managed Care, Other (non HMO) | Admitting: Family Medicine

## 2018-07-16 ENCOUNTER — Encounter: Payer: Self-pay | Admitting: Gastroenterology

## 2020-04-16 ENCOUNTER — Ambulatory Visit
Admit: 2020-04-16 | Discharge: 2020-04-16 | Payer: BLUE CROSS/BLUE SHIELD | Attending: Orthopaedic Surgery | Primary: Student in an Organized Health Care Education/Training Program

## 2020-04-16 ENCOUNTER — Ambulatory Visit: Attending: Orthopaedic Surgery

## 2020-04-16 DIAGNOSIS — M1712 Unilateral primary osteoarthritis, left knee: Secondary | ICD-10-CM

## 2020-04-16 MED ORDER — MELOXICAM 15 MG TAB
15 mg | ORAL_TABLET | Freq: Every day | ORAL | 1 refills | Status: AC
Start: 2020-04-16 — End: ?

## 2020-04-16 NOTE — Progress Notes (Signed)
Progress Notes by Bettina Gavia, MD at 04/16/20 0840                Author: Bettina Gavia, MD  Service: --  Author Type: Physician       Filed: 04/16/20 0941  Encounter Date: 04/16/2020  Status: Signed          Editor: Bettina Gavia, MD (Physician)                          Layli Capshaw   1960-11-02      Chief Complaint       Patient presents with        ?  Knee Pain             left knee            HISTORY OF PRESENT ILLNESS   Veronica Rollins is a 59 y.o.  female who presents today for evaluation of left knee. She rates  her pain 5/10 today. Pain has been present since July 2021. Pain began when she was playing tennis when she turned to a friend. Has pain with sudden movements such as turning quickly. Had swelling on  onset but the swelling has diminished now. She has tried to strengthen the knee with piliates and biking, but the pain persists. Previous ACL reconstruction with a cadaver graft about 10 years ago.    Patient describes the pain as aching and dull that is Intermittent in nature. Symptoms are worse with  stairs and bending and is Rollins with  Rest. Associated symptoms include Swelling. Since  problem started, it: is unchanged. Pain does not wake patient up at night.  Has taken no meds for the problem. Has tried following treatments: Injections:NO; Brace:NO; Therapy:NO; Cane/Crutch:NO          No Known Allergies       History reviewed. No pertinent past medical history.      Social History          Socioeconomic History         ?  Marital status:  UNKNOWN              Spouse name:  Not on file         ?  Number of children:  Not on file     ?  Years of education:  Not on file     ?  Highest education level:  Not on file       Occupational History        ?  Not on file       Tobacco Use         ?  Smoking status:  Never Smoker     ?  Smokeless tobacco:  Never Used       Substance and Sexual Activity         ?  Alcohol use:  Not on file     ?  Drug use:  Not on file     ?   Sexual activity:  Not on file        Other Topics  Concern        ?  Not on file       Social History Narrative        ?  Not on file          Social Determinants of Health          Financial Resource Strain:         ?  Difficulty of Paying Living Expenses:        Food Insecurity:         ?  Worried About Programme researcher, broadcasting/film/video in the Last Year:      ?  Barista in the Last Year:        Transportation Needs:         ?  Freight forwarder (Medical):      ?  Lack of Transportation (Non-Medical):        Physical Activity:         ?  Days of Exercise per Week:      ?  Minutes of Exercise per Session:        Stress:         ?  Feeling of Stress :        Social Connections:         ?  Frequency of Communication with Friends and Family:      ?  Frequency of Social Gatherings with Friends and Family:      ?  Attends Religious Services:      ?  Active Member of Clubs or Organizations:      ?  Attends Banker Meetings:      ?  Marital Status:        Intimate Partner Violence:         ?  Fear of Current or Ex-Partner:      ?  Emotionally Abused:      ?  Physically Abused:         ?  Sexually Abused:            Past Surgical History:         Procedure  Laterality  Date          ?  HX ACL RECONSTRUCTION  Left  10years ago         History reviewed. No pertinent family history.      Current Outpatient Medications        Medication  Sig         ?  timolol (TIMOPTIC) 0.5 % ophthalmic solution  timolol maleate 0.5 % eye drops          No current facility-administered medications for this visit.           REVIEW OF SYSTEM    Patient denies: Weight loss, Fever/Chills, HA, Visual changes, Fatigue, Chest pain, SOB, Abdominal pain, N/V/D/C, Blood in stool or urine, Edema.    Pertinent positive as above in HPI. All others were negative      PHYSICAL EXAM:    Visit Vitals      Pulse  60     Temp  96.9 ??F (36.1 ??C) (Temporal)     Ht  5\' 2"  (1.575 m)     Wt  124 lb (56.2 kg)     SpO2  99%        BMI  22.68 kg/m??         The patient is a well-developed, well-nourished female    in no acute distress.  The patient is alert and oriented times three.  The patient is alert and oriented times three. Mood and affect are normal.   LYMPHATIC: lymph nodes are not enlarged and are within normal limits   SKIN: normal in color and non tender to palpation. There are no bruises or abrasions noted.    NEUROLOGICAL: Motor sensory exam  is within normal limits. Reflexes are equal bilaterally. There is normal sensation to pinprick and light touch   MUSCULOSKELETAL:      Examination  Left knee     Skin  Intact     Range of motion  0-130        Effusion  -        Medial joint line tenderness  -     Lateral joint line tenderness  -        Tenderness Pes Bursa  -     Tenderness insertion MCL  -     Tenderness insertion LCL  -     McMurrays  -        Patella crepitus  +     Patella grind  +     Lachman  +     Pivot shift  -     Anterior drawer  -     Posterior drawer  -     Varus stress  -     Valgus stress  -        Neurovascular  Intact        Calf Swelling and Tenderness to Palpation  -     Homan's Test  -        Hamstring Cord Tightness  -       IMAGING: XR of the left knee with 2 views obtained  in the office dated 04/16/2020 was reviewed and read by Dr. Idelle Crouch: Mild degenerative changes in the patellofemoral joint       IMPRESSION:               ICD-10-CM  ICD-9-CM             1.  Patellofemoral arthritis of left knee   M17.12  716.96  REFERRAL TO PHYSICAL THERAPY                meloxicam (MOBIC) 15 mg tablet           2.  Chronic pain of left knee   M25.562  719.46  AMB POC XRAY, KNEE; 1/2 VIEWS            G89.29  338.29             3.  ACL graft tear, initial encounter (HCC)   T84.89XA  996.78  AMB SUPPLY ORDER            PLAN:   1. Patient presents today with left knee discomfort due to patellofemoral arthritis and a possible ACL graft tear. I am hopeful starting PT and taking mobic to help with pain and inflammation. Can continue  to play tennis,  but I would like her to wear a ACL brace to manage instability. I discussed next steps if she is still having recurrent issues such as a MRI. Return in 4 weeks.    Risk factors include: n/a   2. No ultrasound exam indicated today   3. No cortisone injection indicated today    4. Yes Physical/Occupational Therapy indicated today   5. No diagnostic test indicated today:    6. Yes durable medical equipment indicated today ACL BRACE   7. No referral indicated today    8. Yes medications indicated today: MOBIC   9. No Narcotic indicated today       RTC 4 weeks         Scribed by [facility]) as dictated by Bettina Gavia, MD  I, Dr. Richardo Hanks, confirm that all documentation is accurate.      Richardo Hanks, M.D.    [facility]

## 2020-04-23 ENCOUNTER — Inpatient Hospital Stay
Admit: 2020-04-23 | Payer: BLUE CROSS/BLUE SHIELD | Primary: Student in an Organized Health Care Education/Training Program

## 2020-04-23 DIAGNOSIS — M25562 Pain in left knee: Secondary | ICD-10-CM

## 2020-04-23 NOTE — Progress Notes (Signed)
 [facility]  [address]  [place], [place] [address]  [phone]  [phone] fax    Plan of Care/ Statement of Necessity for Physical Therapy Services    Patient name: Conchetta Lamia Start of Care: 04/23/2020   Referral source: Vale Hover,* DOB: Mar 26, 1961    Medical Diagnosis: Left knee pain [M25.562]  Payor: BLUE CROSS / Plan: [place]  PPO / Product Type: PPO /  Onset Date: July 2021    Treatment Diagnosis: left knee pain   Prior Hospitalization: see medical history Provider#: [id number]   Medications: Verified on Patient summary List    Comorbidities: Left ACL repair(2011)   Prior Level of Function: functionally I with all tasks. Plays tennis, golf, active lifestyle      The Plan of Care and following information is based on the information from the initial evaluation.  Assessment/ key information: 59 y/o female presents with c/o left anterior knee pain that started in July while playing tennis. She reports difficulty with recreation, stairs, walking, squatting. The pt demonstrates some atrophy of the left VMO as compared to the right. MMT reveals weakness of the left quads, hamstrings, glute med, and adductors. No edema is present. Tenderness is noted over the patellar tendon of the left knee and + myofascial tightness and restrictions noted over the distal left quads. No tenderness noted of the medial and lateral joint lines. Decreased left sided core stability is noted as compared to the right. + quad tightness is noted with flexibility testing. The pt will benefit from PT to address the aforementioned impairments.      Evaluation Complexity History MEDIUM  Complexity : 1-2 comorbidities / personal factors will impact the outcome/ POC ; Examination MEDIUM Complexity : 3 Standardized tests and measures addressing body structure, function, activity limitation and / or participation in recreation  ;Presentation MEDIUM Complexity : Evolving  with changing characteristics  ;Clinical Decision Making MEDIUM Complexity : FOTO score of 26-74  Overall Complexity Rating: MEDIUM  Problem List: pain affecting function, decrease ROM, decrease strength, decrease ADL/ functional abilitiies, decrease activity tolerance and decrease flexibility/ joint mobility   Treatment Plan may include any combination of the following: Therapeutic exercise, Therapeutic activities, Neuromuscular re-education, Physical agent/modality, Manual therapy, Aquatic therapy, Patient education and Self Care training  Patient / Family readiness to learn indicated by: asking questions, trying to perform skills and interest  Persons(s) to be included in education: patient (P)  Barriers to Learning/Limitations: None  Patient Goal (s): "To strengthen my knee muscles"  Patient Self Reported Health Status: good  Rehabilitation Potential: good    Short Term Goals: To be accomplished in 1 weeks:   1. The pt will be I and compliant with HEP    Long Term Goals: To be accomplished in 4 weeks:   1. Improve FOTO score to predicted outcome for improved ability for daily tasks   2. The pt will report being able to walk a mile without difficulty.    3. The pt will report at least 50% improvement to improve ability for her daily activities   4. Improve left quad MMT to 5/5 without pain increase to improve ability for stairs and other daily activities    Frequency / Duration: Patient to be seen 2 times per week for 4 weeks.    Patient/ Caregiver education and instruction: Diagnosis, prognosis, self care, activity modification and exercises   [x]   Plan of care has been  reviewed with PTA    Tanda FORBES Dixons, PT 04/23/2020 11:26 AM    ________________________________________________________________________    I certify that the above Therapy Services are being furnished while the patient is under my care. I agree with the treatment plan and certify that this therapy is necessary.    Physician's  Signature:____________Date:_________TIME:________     Vale Hover,*  ** Signature, Date and Time must be completed for valid certification **    Please sign and return to [facility]  [address] [place] [address]  [place], [place] [address]  [phone]  [phone] fax

## 2020-04-23 NOTE — Progress Notes (Signed)
 PT DAILY TREATMENT NOTE 06-21    Patient Name: Veronica Rollins  Date:04/23/2020  DOB: [birth year]  [x]   Patient DOB Verified  Payor: BLUE CROSS / Plan: [place] BLUE CROSS OF [place]  PPO / Product Type: PPO /    In time:11:32  Out time:12:13  Total Treatment Time (min):41   Visit #: 1 of 8    Medicare/BCBS Only   Total Timed Codes (min):  15 1:1 Treatment Time:  41       Treatment Area: Left knee pain [M25.562]    SUBJECTIVE  Pain Level (0-10 scale): 1  Any medication changes, allergies to medications, adverse drug reactions, diagnosis change, or new procedure performed?: [x]  No    []  Yes (see summary sheet for update)  Subjective functional status/changes:   []  No changes reported       OBJECTIVE    26 min [x] Eval                  [] Re-Eval       15 min Therapeutic Exercise:  []  See flow sheet :HEP   Rationale: increase ROM and increase strength to improve the patient's ability to perform ADL            With   []  TE   []  TA   []  neuro   []  other: Patient Education: [x]  Review HEP    []  Progressed/Changed HEP based on:   []  positioning   []  body mechanics   []  transfers   []  heat/ice application    []  other:      Other Objective/Functional Measures:       Pain Level (0-10 scale) post treatment: 1    ASSESSMENT/Changes in Function:      Patient will continue to benefit from skilled PT services to modify and progress therapeutic interventions, address functional mobility deficits, address ROM deficits, address strength deficits, analyze and address soft tissue restrictions, analyze and cue movement patterns and analyze and modify body mechanics/ergonomics to attain remaining goals.     [x]   See Plan of Care  []   See progress note/recertification  []   See Discharge Summary         Progress towards goals / Updated goals:  Short Term Goals: To be accomplished in 1 weeks:   1. The pt will be I and compliant with HEP   IE- issued HEP    Long Term Goals: To be accomplished in 4 weeks:   1. Improve FOTO score to predicted outcome  for improved ability for daily tasks   IE- 60   2. The pt will report being able to walk a mile without difficulty.    IE- moderate   3. The pt will report at least 50% improvement to improve ability for her daily activities   IE- 0-    4. Improve left quad MMT to 5/5 without pain increase to improve ability for stairs and other daily activities   IE- 4+/5 with pain      PLAN  []   Upgrade activities as tolerated     [x]   Continue plan of care  []   Update interventions per flow sheet       []   Discharge due to:_  []   Other:_      Tanda FORBES Dixons, PT 04/23/2020  11:24 AM    Future Appointments   Date Time Provider Department Center   04/23/2020 11:30 AM Dixons Tanda FORBES, PT [facility]   05/14/2020  1:00 PM Vale Hover, MD [facility]  [facility]

## 2020-04-30 ENCOUNTER — Inpatient Hospital Stay
Admit: 2020-04-30 | Payer: BLUE CROSS/BLUE SHIELD | Primary: Student in an Organized Health Care Education/Training Program

## 2020-04-30 DIAGNOSIS — M25562 Pain in left knee: Secondary | ICD-10-CM

## 2020-04-30 NOTE — Progress Notes (Signed)
PT DAILY TREATMENT NOTE 06-21    Patient Name: Veronica Rollins  Date:04/30/2020  DOB: 05-15-1961  [x]   Patient DOB Verified  Payor: BLUE CROSS / Plan: [place] BLUE CROSS OF [place] PPO / Product Type: PPO /    In time:1:34  Out time:2:13  Total Treatment Time (min): 39  Visit #: 2 of 8    Medicare/BCBS Only   Total Timed Codes (min):  39 1:1 Treatment Time:  38       Treatment Area: Left knee pain [M25.562]    SUBJECTIVE  Pain Level (0-10 scale): 1/10  Any medication changes, allergies to medications, adverse drug reactions, diagnosis change, or new procedure performed?: [x]  No    []  Yes (see summary sheet for update)  Subjective functional status/changes:   [x]  No changes reported    OBJECTIVE    18 min Therapeutic Exercise:  [x]  See flow sheet :   Rationale: increase ROM and increase strength to improve the patient's ability to perform ADL's.    12 min Neuromuscular Re-education:  [x]   See flow sheet : Band-walks, Keiser SLS pull downs, Corealign series.   Rationale: increase strength, improve coordination and increase proprioception  to improve the patient's ability to perform functional activities.    8 min Manual Therapy:  Graston technique to Distal quads, framed patella. Pt seated.   The manual therapy interventions were performed at a separate and distinct time from the therapeutic activities interventions.  Rationale: decrease pain, increase tissue extensibility and decrease trigger points to improve ease of performing functional activities.    With   [x]  TE   []  TA   []  neuro   []  other: Patient Education: [x]  Review HEP    []  Progressed/Changed HEP based on:   []  positioning   []  body mechanics   []  transfers   []  heat/ice application    []  other:      Other Objective/Functional Measures: Initiated exercises per flow sheet. Cueing for proper exercise mechanics.    Pain Level (0-10 scale) post treatment: 0/10    ASSESSMENT/Changes in Function: First F/U visit. Hypersensitive with Graston to Medial quads. Pt  denied pain post-treatment, reported knee feeling "warm" after Graston. Pt fully participated in treatment and left in no apparent distress. Continue PT to increase Left LE strength/stability to improve ease of performing functional activities.    Patient will continue to benefit from skilled PT services to modify and progress therapeutic interventions, address functional mobility deficits, address ROM deficits, address strength deficits, analyze and address soft tissue restrictions, analyze and cue movement patterns and analyze and modify body mechanics/ergonomics to attain remaining goals.     [x]   See Plan of Care  []   See progress note/recertification  []   See Discharge Summary         Progress towards goals / Updated goals:  Short Term Goals: To be accomplished in 1 weeks:              1. The pt will be I and compliant with HEP              IE- issued HEP   Current: Met, pt reports compliance. 04/30/2020    Long Term Goals: To be accomplished in 4 weeks:              1. Improve FOTO score to predicted outcome for improved ability for daily tasks              IE- 60  2. The pt will report being able to walk a mile without difficulty.               IE- moderate              3. The pt will report at least 50% improvement to improve ability for her daily activities              IE- 0-               4. Improve left quad MMT to 5/5 without pain increase to improve ability for stairs and other daily activities              IE- 4+/5 with pain    PLAN  []   Upgrade activities as tolerated     [x]   Continue plan of care  []   Update interventions per flow sheet       []   Discharge due to:_  []   Other:_      Moshe Salisbury, PTA 04/30/2020  1:31 PM    Future Appointments   Date Time Provider Department Center   05/02/2020  2:30 PM Orson Ape, PT [facility]   05/05/2020  2:15 PM Moshe Salisbury, PTA [facility]   05/07/2020 10:45 AM Willa Frater, PT [facility]   05/12/2020 10:30 AM  Moshe Salisbury, PTA [facility]   05/14/2020  1:00 PM Bettina Gavia, MD [facility]   05/14/2020  2:30 PM Orson Ape, PT [facility]   05/19/2020 10:30 AM Orson Ape, PT [facility]   05/21/2020 10:30 AM Moshe Salisbury, PTA [facility]

## 2020-05-02 ENCOUNTER — Encounter: Payer: BLUE CROSS/BLUE SHIELD | Primary: Student in an Organized Health Care Education/Training Program

## 2020-05-05 ENCOUNTER — Inpatient Hospital Stay
Admit: 2020-05-05 | Payer: BLUE CROSS/BLUE SHIELD | Primary: Student in an Organized Health Care Education/Training Program

## 2020-05-05 NOTE — Progress Notes (Signed)
PT DAILY TREATMENT NOTE 06-21    Patient Name: Veronica Rollins  Date:05/05/2020  DOB: March 11, 1961  [x]   Patient DOB Verified  Payor: BLUE CROSS / Plan: [place] BLUE CROSS OF [place] PPO / Product Type: PPO /    In time:2:25  Out time:2:58  Total Treatment Time (min): 33  Visit #: 3 of 8    Medicare/BCBS Only   Total Timed Codes (min):  33 1:1 Treatment Time:  33       Treatment Area: Left knee pain [M25.562]    SUBJECTIVE  Pain Level (0-10 scale): 0/10  Any medication changes, allergies to medications, adverse drug reactions, diagnosis change, or new procedure performed?: [x]  No    []  Yes (see summary sheet for update)  Subjective functional status/changes:   []  No changes reported  "It's been feeling pretty good." Pt 10' late to appointment.    OBJECTIVE    13 min Therapeutic Exercise:  [x]  See flow sheet :   Rationale: increase ROM and increase strength to improve the patient's ability to perform ADL's.    12 min Neuromuscular Re-education:  [x]   See flow sheet : Band-walks, Keiser SLS pull downs, Corealign series.   Rationale: increase strength, improve coordination, improve balance and increase proprioception  to improve the patient's ability to perform functional activities.    8 min Manual Therapy:  Graston technique to Distal quads and ITB, framed patella. Pt seated.   The manual therapy interventions were performed at a separate and distinct time from the therapeutic activities interventions.  Rationale: decrease pain, increase ROM, increase tissue extensibility and decrease trigger points to improve ease of performing functional activities.    With   [x]  TE   []  TA   []  neuro   []  other: Patient Education: [x]  Review HEP    []  Progressed/Changed HEP based on:   []  positioning   []  body mechanics   []  transfers   []  heat/ice application    []  other:      Other Objective/Functional Measures: Increased prone hip ext to 15 reps. Added single leg shuttle press.     Pain Level (0-10 scale) post treatment:  0/10    ASSESSMENT/Changes in Function: Pt reports having less pain in the last few days. Fatigued quickly with lateral band-walks. Mild Left knee pain with shuttle press DL and SL. Pt further reports less pain while negotiating stairs at home than previously. Continue PT to increase Left strength/stability and decrease mm/fascia restrictions to improve ease of performing functional activities.    Patient will continue to benefit from skilled PT services to modify and progress therapeutic interventions, address functional mobility deficits, address ROM deficits, address strength deficits, analyze and address soft tissue restrictions, analyze and cue movement patterns and analyze and modify body mechanics/ergonomics to attain remaining goals.     [x]   See Plan of Care  []   See progress note/recertification  []   See Discharge Summary         Progress towards goals / Updated goals:  Short Term Goals:To be accomplished in 1 weeks:  1. The pt will be I and compliant with HEP  IE- issued HEP              Current: Met, pt reports compliance. 04/30/2020    Long Term Goals:To be accomplished in 4 weeks:  1. Improve FOTO score to predicted outcome for improved ability for daily tasks  IE- 60  2. The pt will report being able to walk a mile without  difficulty.   IE- moderate  3. The pt will report at least 50% improvement to improve ability for her daily activities  IE- 0-   4. Improve left quad MMT to 5/5 without pain increase to improve ability for stairs and other daily activities  IE- 4+/5 with pain    PLAN  []   Upgrade activities as tolerated     [x]   Continue plan of care  []   Update interventions per flow sheet       []   Discharge due to:_  []   Other:_      Moshe Salisbury, PTA 05/05/2020  2:27 PM    Future Appointments   Date Time Provider Department Center   05/07/2020 10:45 AM Willa Frater, PT [facility]   05/12/2020 10:30 AM Moshe Salisbury, PTA [facility]   05/14/2020  1:00 PM Bettina Gavia, MD [facility]   05/14/2020  2:30 PM Orson Ape, PT [facility]   05/19/2020 10:30 AM Orson Ape, PT [facility]   05/21/2020 10:30 AM Moshe Salisbury, PTA [facility]

## 2020-05-07 ENCOUNTER — Inpatient Hospital Stay
Admit: 2020-05-07 | Payer: BLUE CROSS/BLUE SHIELD | Primary: Student in an Organized Health Care Education/Training Program

## 2020-05-07 NOTE — Progress Notes (Signed)
 PT DAILY TREATMENT NOTE 06-21    Patient Name: Veronica Rollins  Date:05/07/2020  DOB: 1961-02-21  [x]   Patient DOB Verified  Payor: BLUE CROSS / Plan: [place] BLUE CROSS OF [place]  PPO / Product Type: PPO /    In time:1050   Out time:1130  Total Treatment Time (min): 40  Visit #: 4 of 8    Medicare/BCBS Only   Total Timed Codes (min):  40 1:1 Treatment Time:  40       Treatment Area: Left knee pain [M25.562]    SUBJECTIVE  Pain Level (0-10 scale): 0-1  Any medication changes, allergies to medications, adverse drug reactions, diagnosis change, or new procedure performed?: [x]  No    []  Yes (see summary sheet for update)  Subjective functional status/changes:   []  No changes reported  The pt reports she feels she is making some progress.     OBJECTIVE    Modality rationale: decrease pain and increase tissue extensibility to improve the patient's ability to perform ADL   Min Type Additional Details    []  Estim:  [] Unatt       [] IFC  [] Premod                        [] Other:  [] w/ice   [] w/heat  Position:  Location:    []  Estim: [] Att    [] TENS instruct  [] NMES                    [] Other:  [] w/US    [] w/ice   [] w/heat  Position:  Location:    []   Traction: []  Cervical       [] Lumbar                       []  Prone          [] Supine                       [] Intermittent   [] Continuous Lbs:  []  before manual  []  after manual     Location:  W/cm2:   8 [x]   Ultrasound: [x] Continuous   []  Pulsed                           []   [x] Location: left patellar tendon  W/cm2:0.8    []   Ice     []   heat  []   Ice massage  []   Laser   []   Anodyne Position:  Location:    []   Laser with stim  []   Other: Position:  Location:    []   Vasopneumatic Device  Pre-treatment girth:  Post-treatment girth:  Measured at (location):  Pressure:       []  lo []  med []  hi   Temperature: []  lo []  med []  hi   []  Skin assessment post-treatment:  [] intact [] redness- no adverse reaction    [] redness - adverse reaction:           22 min Therapeutic Exercise:   [x]  See flow sheet :   Rationale: increase ROM and increase strength to improve the patient's ability to perform ADL       10 min Neuromuscular Re-education:  [x]   See flow sheet : Core align and reformer series   Rationale: increase strength, improve coordination and increase proprioception  to improve the patient's ability to perform ADL  With   []  TE   []  TA   []  neuro   []  other: Patient Education: [x]  Review HEP    []  Progressed/Changed HEP based on:   []  positioning   []  body mechanics   []  transfers   []  heat/ice application    []  other:      Other Objective/Functional Measures:       Pain Level (0-10 scale) post treatment: 0    ASSESSMENT/Changes in Function:  The pt is progressing well with therex with PT. She reports improvement since starting.     Patient will continue to benefit from skilled PT services to modify and progress therapeutic interventions, address functional mobility deficits, address strength deficits, analyze and address soft tissue restrictions, analyze and cue movement patterns and analyze and modify body mechanics/ergonomics to attain remaining goals.     []   See Plan of Care  []   See progress note/recertification  []   See Discharge Summary         Progress towards goals / Updated goals:  Short Term Goals:To be accomplished in 1 weeks:  1. The pt will be I and compliant with HEP  IE- issued HEP  Current: Met, pt reports compliance. 04/30/2020    Long Term Goals:To be accomplished in 4 weeks:  1. Improve FOTO score to predicted outcome for improved ability for daily tasks  IE- 60  2. The pt will report being able to walk a mile without difficulty.   IE- moderate  3. The pt will report at least 50% improvement to improve ability for her daily activities  IE- 0-    Current progressing on 05-07-20  4. Improve left quad MMT to 5/5 without pain increase to improve  ability for stairs and other daily activities  IE- 4+/5 with pain    PLAN  []   Upgrade activities as tolerated     []   Continue plan of care  []   Update interventions per flow sheet       []   Discharge due to:_  []   Other:_      Tanda FORBES Dixons, PT 05/07/2020  10:50 AM    Future Appointments   Date Time Provider Department Center   05/12/2020 10:30 AM Rosanne Arthea BROCKS, PTA [facility]   05/14/2020  1:00 PM Vale Hover, MD [facility]   05/14/2020  2:30 PM Marlowe Dorn RAMAN, PT [facility]   05/19/2020 10:30 AM Marlowe Dorn RAMAN, PT [facility]   05/21/2020 10:30 AM Rosanne Arthea BROCKS, PTA [facility]

## 2020-05-12 ENCOUNTER — Encounter: Payer: BLUE CROSS/BLUE SHIELD | Primary: Student in an Organized Health Care Education/Training Program

## 2020-05-14 ENCOUNTER — Ambulatory Visit
Admit: 2020-05-14 | Discharge: 2020-05-14 | Payer: BLUE CROSS/BLUE SHIELD | Attending: Orthopaedic Surgery | Primary: Student in an Organized Health Care Education/Training Program

## 2020-05-14 ENCOUNTER — Inpatient Hospital Stay
Admit: 2020-05-14 | Payer: BLUE CROSS/BLUE SHIELD | Primary: Student in an Organized Health Care Education/Training Program

## 2020-05-14 ENCOUNTER — Ambulatory Visit: Attending: Orthopaedic Surgery

## 2020-05-14 DIAGNOSIS — M1712 Unilateral primary osteoarthritis, left knee: Secondary | ICD-10-CM

## 2020-05-14 NOTE — Progress Notes (Signed)
Progress Notes by Bettina Gavia, MD at 05/14/20 1300                Author: Bettina Gavia, MD  Service: --  Author Type: Physician       Filed: 05/14/20 1353  Encounter Date: 05/14/2020  Status: Signed          Editor: Bettina Gavia, MD (Physician)                          Veronica Rollins   May 04, 1961      Chief Complaint       Patient presents with        ?  Knee Pain             left knee            HISTORY OF PRESENT ILLNESS   Veronica Rollins is a 59 y.o.  female who presents today for reevaluation of left knee. Patient rates pain as 0/10 today. Has been going to PT and has noticed a good improvement. She has no pain today. Has no swelling today. Previous  ACL reconstruction with a cadaver graft about 10 years ago.    Patient denies any fever, chills, chest pain, shortness of breath or calf pain. The remainder of the review of systems is negative. There are no new illness or injuries to report since last seen in the office. There are no changes to medications, allergies,  family or social history.       PHYSICAL EXAM:    Visit Vitals      Pulse  73     Temp  97.5 ??F (36.4 ??C) (Temporal)     Ht  5\' 2"  (1.575 m)     Wt  124 lb (56.2 kg)     SpO2  99%        BMI  22.68 kg/m??        The patient is a well-developed, well-nourished female    in no acute distress.  The patient is alert and oriented times three.  The patient is alert and oriented times three. Mood and affect are normal.   LYMPHATIC: lymph nodes are not enlarged and are within normal limits   SKIN: normal in color and non tender to palpation. There are no bruises or abrasions noted.    NEUROLOGICAL: Motor sensory exam is within normal limits. Reflexes are equal bilaterally. There is normal sensation to pinprick and light touch   MUSCULOSKELETAL:      Examination  Left knee     Skin  Intact     Range of motion  0-130        Effusion  -        Medial joint line tenderness  -     Lateral joint line tenderness  -         Tenderness Pes Bursa  -     Tenderness insertion MCL  -     Tenderness insertion LCL  -     McMurrays  -        Patella crepitus  +     Patella grind  +     Lachman  +     Pivot shift  -     Anterior drawer  -     Posterior drawer  -     Varus stress  -     Valgus stress  -  Neurovascular  Intact        Calf Swelling and Tenderness to Palpation  -     Homan's Test  -     Hamstring Cord Tightness  -           IMAGING: XR of the left knee with 2 views obtained in the office dated 04/16/2020 was reviewed and read by Dr. Idelle Crouch: Mild degenerative  changes in the patellofemoral joint       IMPRESSION:               ICD-10-CM  ICD-9-CM             1.  Patellofemoral arthritis of left knee   M17.12  716.96              PLAN:    1. Patient presents today following up on left knee discomfort. she is doing well today after attending PT. I emphasized the importance of using pain as a guide and limiting squatting, kneeling, and stairs. OK to start playing tennis but warned to gradually  ease into it. Continue stretching the hamstrings at home. Return as needed.    Risk factors include: n/a   2. No ultrasound exam indicated today   3. No cortisone injection indicated today    4. No Physical/Occupational Therapy indicated today   5. No diagnostic test indicated today:    6. No durable medical equipment indicated today   7. No referral indicated today    8. No medications indicated today:    9. No Narcotic indicated today      RTC prn         Scribed by Curt Jews [facility]) as dictated by Bettina Gavia, MD      I, Dr. Bettina Gavia, confirm that all documentation is accurate.      Bettina Gavia, M.D.    [facility]

## 2020-05-14 NOTE — Progress Notes (Signed)
 PT DAILY TREATMENT NOTE 06-21    Patient Name: Veronica Rollins  Date:05/14/2020  DOB: Feb 24, 1961  [x]   Patient DOB Verified  Payor: BLUE CROSS / [place]: [facility] [place]  PPO / Product Type: PPO /    In time:9:44  Out time:10:25  Total Treatment Time (min): 41  Visit #: 5 of 8    Medicare/BCBS Only   Total Timed Codes (min):  41 1:1 Treatment Time:  41       Treatment Area: Left knee pain [M25.562]    SUBJECTIVE  Pain Level (0-10 scale): 1/10  Any medication changes, allergies to medications, adverse drug reactions, diagnosis change, or new procedure performed?: [x]  No    []  Yes (see summary sheet for update)  Subjective functional status/changes:   []  No changes reported  It's a little angry today. I did a lot of yard work yesterday.    OBJECTIVE    Modality rationale: decrease inflammation, decrease pain and increase tissue extensibility to improve the patient's ability to perform ADL's.   Min Type Additional Details    []  Estim:  [] Unatt       [] IFC  [] Premod                        [] Other:  [] w/ice   [] w/heat  Position:  Location:    []  Estim: [] Att    [] TENS instruct  [] NMES                    [] Other:  [] w/US    [] w/ice   [] w/heat  Position:  Location:    []   Traction: []  Cervical       [] Lumbar                       []  Prone          [] Supine                       [] Intermittent   [] Continuous Lbs:  []  before manual  []  after manual   6+ 2 setup [x]   Ultrasound: [x] Continuous   []  Pulsed                           []   [x] W/cm2:  1.0  Location: Left patellar tendon    []   Iontophoresis with dexamethasone         Location: []  Take home patch   []  In clinic    []   Ice     []   heat  []   Ice massage  []   Laser   []   Anodyne Position:  Location:    []   Laser with stim  []   Other:  Position:  Location:    []   Vasopneumatic Device    []   Right     []   Left  Pre-treatment girth:  Post-treatment girth:  Measured at (location):  Pressure:       []  lo []  med []  hi   Temperature: []  lo []  med []  hi   []   Skin assessment post-treatment:  [] intact [] redness- no adverse reaction    [] redness - adverse reaction:     18 min Therapeutic Exercise:  [x]  See flow sheet :   Rationale: increase ROM and increase strength to improve the patient's ability to perform ADL's.     15 min Neuromuscular Re-education:  [x]   See flow  sheet : Band-walks, reformer series, keiser pull downs, [employer] series.   Rationale: increase strength, improve coordination and increase proprioception  to improve the patient's ability to perform functional activities.    With   [x]  TE   []  TA   []  neuro   []  other: Patient Education: [x]  Review HEP    []  Progressed/Changed HEP based on:   []  positioning   []  body mechanics   []  transfers   []  heat/ice application    []  other:      Other Objective/Functional Measures: Increased reps/resistance for several exercises per flow sheet.     Pain Level (0-10 scale) post treatment: 0/10    ASSESSMENT/Changes in Function: Pt reports no difficulty walking a mile on flat terrain. Demonstrated improved strength, able to perform exercises with increased resistance. Pt denied pain post-treatment. Met LTG #2. Continue PT to further increase strength/stability to improve ease of performing functional activities.    Patient will continue to benefit from skilled PT services to modify and progress therapeutic interventions, address functional mobility deficits, address ROM deficits, address strength deficits, analyze and address soft tissue restrictions, analyze and cue movement patterns and analyze and modify body mechanics/ergonomics to attain remaining goals.     [x]   See Plan of Care  []   See progress note/recertification  []   See Discharge Summary         Progress towards goals / Updated goals:  Short Term Goals:To be accomplished in 1 weeks:  1. The pt will be I and compliant with HEP  IE- issued HEP  Current: Met, pt reports compliance. 04/30/2020    Long Term Goals:To be  accomplished in 4 weeks:  1. Improve FOTO score to predicted outcome for improved ability for daily tasks  IE- 60  2. The pt will report being able to walk a mile without difficulty.   IE- moderate   Current: Pt reports no difficulty walking a mile on flat terrain. 05/14/2020  3. The pt will report at least 50% improvement to improve ability for her daily activities  IE- 0-               Current progressing on 05-07-20  4. Improve left quad MMT to 5/5 without pain increase to improve ability for stairs and other daily activities  IE- 4+/5 with pain    PLAN  []   Upgrade activities as tolerated     [x]   Continue plan of care  []   Update interventions per flow sheet       []   Discharge due to:_  []   Other:_      Arthea JAYSON Lands, PTA 05/14/2020  9:43 AM    Future Appointments   Date Time Provider Department Center   05/14/2020  9:45 AM Lands Arthea JAYSON, PTA [facility]   05/14/2020  1:00 PM Vale Hover, MD [facility]   05/16/2020 10:45 AM Malvina Tanda BRAVO, PT [facility]   05/19/2020 10:30 AM Marlowe Dorn RAMAN, PT [facility]   05/21/2020 10:30 AM Lands Arthea JAYSON, PTA [facility]

## 2020-05-16 ENCOUNTER — Inpatient Hospital Stay
Admit: 2020-05-16 | Payer: BLUE CROSS/BLUE SHIELD | Primary: Student in an Organized Health Care Education/Training Program

## 2020-05-16 NOTE — Progress Notes (Signed)
 PT DAILY TREATMENT NOTE 06-21    Patient Name: Veronica Rollins  Date:05/16/2020  DOB: [birth year]  [x]   Patient DOB Verified  Payor: BLUE CROSS / [place]: [facility] [place]  PPO / Product Type: PPO /    In time:1046  Out time:1133  Total Treatment Time (min): 47  Visit #: 6 of 8    Medicare/BCBS Only   Total Timed Codes (min):  47 1:1 Treatment Time:  47       Treatment Area: Left knee pain [M25.562]    SUBJECTIVE  Pain Level (0-10 scale): 1  Any medication changes, allergies to medications, adverse drug reactions, diagnosis change, or new procedure performed?: [x]  No    []  Yes (see summary sheet for update)  Subjective functional status/changes:   []  No changes reported  The pt reports she is doing better.     OBJECTIVE    Modality rationale: decrease pain and increase tissue extensibility to improve the patient's ability to perform ADL   Min Type Additional Details    []  Estim:  [] Unatt       [] IFC  [] Premod                        [] Other:  [] w/ice   [] w/heat  Position:  Location:    []  Estim: [] Att    [] TENS instruct  [] NMES                    [] Other:  [] w/US    [] w/ice   [] w/heat  Position:  Location:    []   Traction: []  Cervical       [] Lumbar                       []  Prone          [] Supine                       [] Intermittent   [] Continuous Lbs:  []  before manual  []  after manual   6'+2' set up  [x]   Ultrasound: [x] Continuous   []  Pulsed                           []   [x] W/cm2: 0.8  Location: left patellar tendon    []   Iontophoresis with dexamethasone         Location: []  Take home patch   []  In clinic    []   Ice     []   heat  []   Ice massage  []   Laser   []   Anodyne Position:  Location:    []   Laser with stim  []   Other:  Position:  Location:    []   Vasopneumatic Device    []   Right     []   Left  Pre-treatment girth:  Post-treatment girth:  Measured at (location):  Pressure:       []  lo []  med []  hi   Temperature: []  lo []  med []  hi   []  Skin assessment post-treatment:  [] intact [] redness- no  adverse reaction    [] redness - adverse reaction:     27 min Therapeutic Exercise:  [x]  See flow sheet :   Rationale: increase ROM and increase strength to improve the patient's ability to perform ADL     12 min Neuromuscular Re-education:  [x]   See flow sheet : Reformer, core align series  Rationale: increase strength, improve coordination and increase proprioception  to improve the patient's ability to perform ADL            With   []  TE   []  TA   []  neuro   []  other: Patient Education: [x]  Review HEP    []  Progressed/Changed HEP based on:   []  positioning   []  body mechanics   []  transfers   []  heat/ice application    []  other:      Other Objective/Functional Measures:  Progressed therex per flow sheet     Pain Level (0-10 scale) post treatment: 0    ASSESSMENT/Changes in Function:  Significant improvement noted per pt report. She is progressing with strengthening as noted with today's progression of therex.     Patient will continue to benefit from skilled PT services to modify and progress therapeutic interventions, address functional mobility deficits, address ROM deficits, address strength deficits, analyze and address soft tissue restrictions and analyze and cue movement patterns to attain remaining goals.     []   See Plan of Care  []   See progress note/recertification  []   See Discharge Summary         Progress towards goals / Updated goals:  Short Term Goals:To be accomplished in 1 weeks:  1. The pt will be I and compliant with HEP  IE- issued HEP  Current: Met, pt reports compliance. 04/30/2020    Long Term Goals:To be accomplished in 4 weeks:  1. Improve FOTO score to predicted outcome for improved ability for daily tasks  IE- 60  2. The pt will report being able to walk a mile without difficulty.   IE- moderate              Current: Pt reports no difficulty walking a mile on flat terrain. 05/14/2020  3.  The pt will report at least 50% improvement to improve ability for her daily activities  IE- 0  Current: MET 95% improvement reported 05-16-20  4. Improve left quad MMT to 5/5 without pain increase to improve ability for stairs and other daily activities  IE- 4+/5 with pain    PLAN  []   Upgrade activities as tolerated     [x]   Continue plan of care  []   Update interventions per flow sheet       []   Discharge due to:_  []   Other:_      Tanda FORBES Dixons, PT 05/16/2020  10:49 AM    Future Appointments   Date Time Provider Department Center   05/19/2020 10:30 AM Compton, Dorn RAMAN, PT [facility]   05/21/2020 10:30 AM Rosanne Arthea BROCKS, PTA [facility]

## 2020-05-19 ENCOUNTER — Encounter: Payer: BLUE CROSS/BLUE SHIELD | Primary: Student in an Organized Health Care Education/Training Program

## 2020-05-21 ENCOUNTER — Inpatient Hospital Stay
Admit: 2020-05-21 | Payer: BLUE CROSS/BLUE SHIELD | Primary: Student in an Organized Health Care Education/Training Program

## 2020-05-21 NOTE — Progress Notes (Signed)
 [facility]  [address]  [place], [place] [address]  [phone]  [phone] fax    Physical Therapy Discharge Summary  Patient name: Veronica Rollins Start of Care: 04/23/2020   Referral source: Vale Hover,* DOB: 1960/08/18   Medical/Treatment Diagnosis: Left knee pain [M25.562]  Payor: BLUE CROSS / Plan: [facility] [place] [facility] [place]  PPO / Product Type: PPO /  Onset Date:July 2021     Prior Hospitalization: see medical history Provider#: [id number]   Medications: Verified on Patient Summary List    Comorbidities: Left ACL repair(2011)  Prior Level of Function:functionally I with all tasks. Plays tennis, golf, active lifestyle   Visits from Start of Care: 7    Missed Visits: 0  Reporting Period : 04/23/2020 to 05/21/2020      Summary of Care:    Progress towards goals:  Short Term Goals:To be accomplished in 1 weeks:  1. The pt will be I and compliant with HEP  IE- issued HEP  Current: Met, pt reports compliance.    Long Term Goals:To be accomplished in 4 weeks:  1. Improve FOTO score to predicted outcome for improved ability for daily tasks  IE- 60              Current: Met, 78%.  2. The pt will report being able to walk a mile without difficulty.   IE- moderate  Current: Pt reports no difficulty walking a mile on flat terrain.  3. The pt will report at least 50% improvement to improve ability for her daily activities  IE- 0  Current: MET 95% improvement reported.  4. Improve left quad MMT to 5/5 without pain increase to improve ability for stairs and other daily activities  IE- 4+/5 with pain              Current: MMT Left quad 4+/5 with slight pain which resolved quickly.    FOTO 78%. MMT Left quad 4+/5 with slight pain which resolved quickly. Pt has made significant progress since IE. Pt reports 80-85%  overall subjective improvement. Continues to have mild discomfort/pain with SL closed chain exercises and with quad MMT. D/C to HEP at this time. Pt reports continued compliance. Educated the importance of continuing HEP and to increase resistance/reps gradually as well as re-introducing recreational activities gradually so as not to exacerbate symptoms. Pt was very receptive to information and how to further questions or concerns.    ASSESSMENT/RECOMMENDATIONS:  [x] Discontinue therapy: [x] Patient has reached or is progressing toward set goals      [] Patient is non-compliant or has abdicated      [] Due to lack of appreciable progress towards set goals    Zachary C Pilkington, PTA 05/21/2020 11:25 AM

## 2020-05-21 NOTE — Progress Notes (Signed)
 PT DAILY TREATMENT NOTE 06-21    Patient Name: Veronica Rollins  Date:05/21/2020  DOB: [birth year]  [x]   Patient DOB Verified  Payor: BLUE CROSS / Plan: [facility] [place] [facility] [place]  PPO / Product Type: PPO /    In time:10:31  Out time:11:11  Total Treatment Time (min): 40  Visit #: 7 of 8    Medicare/BCBS Only   Total Timed Codes (min):  40 1:1 Treatment Time:  40       Treatment Area: Left knee pain [M25.562]    SUBJECTIVE  Pain Level (0-10 scale): 0/10  Any medication changes, allergies to medications, adverse drug reactions, diagnosis change, or new procedure performed?: [x]  No    []  Yes (see summary sheet for update)  Subjective functional status/changes:   []  No changes reported  It's doing pretty good.    OBJECTIVE    Modality rationale: decrease inflammation, decrease pain and increase tissue extensibility to improve the patient's ability to perform ADL's.   Min Type Additional Details    []  Estim:  [] Unatt       [] IFC  [] Premod                        [] Other:  [] w/ice   [] w/heat  Position:  Location:    []  Estim: [] Att    [] TENS instruct  [] NMES                    [] Other:  [] w/US    [] w/ice   [] w/heat  Position:  Location:    []   Traction: []  Cervical       [] Lumbar                       []  Prone          [] Supine                       [] Intermittent   [] Continuous Lbs:  []  before manual  []  after manual   6+ 2 setup [x]   Ultrasound: [x] Continuous   []  Pulsed                           []   [x] W/cm2: 0.8  Location: Left patellar tendon    []   Iontophoresis with dexamethasone         Location: []  Take home patch   []  In clinic    []   Ice     []   heat  []   Ice massage  []   Laser   []   Anodyne Position:  Location:    []   Laser with stim  []   Other:  Position:  Location:    []   Vasopneumatic Device    []   Right     []   Left  Pre-treatment girth:  Post-treatment girth:  Measured at (location):  Pressure:       []  lo []  med []  hi   Temperature: []  lo []  med []  hi   []  Skin assessment post-treatment:  [] intact  [] redness- no adverse reaction    [] redness - adverse reaction:     17 min Therapeutic Exercise:  [x]  See flow sheet :   Rationale: increase ROM and increase strength to improve the patient's ability to perform ADL's.    15 min Neuromuscular Re-education:  [x]   See flow sheet : Band-walks, reformer series, keiser pull downs, [employer] series.  Rationale: increase strength, improve coordination and increase proprioception  to improve the patient's ability to perform functional activities.    With   [x]  TE   []  TA   []  neuro   []  other: Patient Education: [x]  Review HEP    []  Progressed/Changed HEP based on:   []  positioning   []  body mechanics   []  transfers   []  heat/ice application    []  other:      Other Objective/Functional Measures: FOTO 78%. MMT Left quad 4+/5 with slight pain which resolved quickly. Pt has made significant progress since IE. Pt reports 80-85% overall subjective improvement. Continues to have mild discomfort/pain with SL closed chain exercises and with quad MMT. D/C to HEP at this time. Pt reports continued compliance. Educated the importance of continuing HEP and to increase resistance/reps gradually as well as re-introducing recreational activities gradually so as not to exacerbate symptoms. Pt was very receptive to information and how to further questions or concerns.     Pain Level (0-10 scale) post treatment: 0/10    ASSESSMENT/Changes in Function:      []   See Plan of Care  []   See progress note/recertification  [x]   See Discharge Summary         Progress towards goals / Updated goals:  Short Term Goals:To be accomplished in 1 weeks:  1. The pt will be I and compliant with HEP  IE- issued HEP  Current: Met, pt reports compliance. 04/30/2020    Long Term Goals:To be accomplished in 4 weeks:  1. Improve FOTO score to predicted outcome for improved ability for daily tasks  IE- 60   Current: Met, 78%. 05/21/2020  2.  The pt will report being able to walk a mile without difficulty.   IE- moderate  Current: Pt reports no difficulty walking a mile on flat terrain. 05/14/2020  3. The pt will report at least 50% improvement to improve ability for her daily activities  IE- 0  Current: MET 95% improvement reported 05-16-20  4. Improve left quad MMT to 5/5 without pain increase to improve ability for stairs and other daily activities  IE- 4+/5 with pain   Current: MMT Left quad 4+/5 with slight pain which resolved quickly. 05/21/2020    PLAN  []   Upgrade activities as tolerated     []   Continue plan of care  []   Update interventions per flow sheet       [x]   Discharge due to: Met most LTG's, D/C to HEP.   []   Other:_      Arthea JAYSON Lands, PTA 05/21/2020  10:33 AM    Future Appointments   Date Time Provider Department Center   05/26/2020  3:45 PM Pil[place], Zachary C, PTA [facility]

## 2020-05-26 ENCOUNTER — Encounter: Payer: BLUE CROSS/BLUE SHIELD | Primary: Student in an Organized Health Care Education/Training Program
